# Patient Record
Sex: Female | Born: 1950 | Race: Black or African American | Hispanic: No | Smoking: Never smoker
Health system: Southern US, Community
[De-identification: ages and names within clinical notes are randomized; demographics above are authoritative.]

## PROBLEM LIST (undated history)

## (undated) DIAGNOSIS — E785 Hyperlipidemia, unspecified: Secondary | ICD-10-CM

## (undated) DIAGNOSIS — I219 Acute myocardial infarction, unspecified: Secondary | ICD-10-CM

## (undated) DIAGNOSIS — I10 Essential (primary) hypertension: Secondary | ICD-10-CM

## (undated) DIAGNOSIS — E119 Type 2 diabetes mellitus without complications: Secondary | ICD-10-CM

## (undated) HISTORY — DX: Type 2 diabetes mellitus without complications: E11.9

## (undated) HISTORY — DX: Acute myocardial infarction, unspecified: I21.9

## (undated) HISTORY — DX: Hyperlipidemia, unspecified: E78.5

## (undated) HISTORY — DX: Essential (primary) hypertension: I10

---

## 2015-03-23 ENCOUNTER — Inpatient Hospital Stay
Admission: EM | Admit: 2015-03-23 | Discharge: 2015-03-26 | DRG: 282 | Disposition: A | Discharge status: Home / Self Care | Payer: PRIVATE HEALTH INSURANCE | Attending: Internal Medicine | Admitting: Internal Medicine

## 2015-03-23 ENCOUNTER — Emergency Department: Payer: PRIVATE HEALTH INSURANCE

## 2015-03-23 ENCOUNTER — Encounter: Payer: Self-pay | Admitting: Emergency Medicine

## 2015-03-23 DIAGNOSIS — Z9119 Patient's noncompliance with other medical treatment and regimen: Secondary | ICD-10-CM | POA: Diagnosis not present

## 2015-03-23 DIAGNOSIS — F17210 Nicotine dependence, cigarettes, uncomplicated: Secondary | ICD-10-CM | POA: Diagnosis present

## 2015-03-23 DIAGNOSIS — I160 Hypertensive urgency: Secondary | ICD-10-CM | POA: Diagnosis present

## 2015-03-23 DIAGNOSIS — R0600 Dyspnea, unspecified: Secondary | ICD-10-CM | POA: Diagnosis present

## 2015-03-23 DIAGNOSIS — E785 Hyperlipidemia, unspecified: Secondary | ICD-10-CM | POA: Diagnosis present

## 2015-03-23 DIAGNOSIS — I214 Non-ST elevation (NSTEMI) myocardial infarction: Principal | ICD-10-CM | POA: Diagnosis present

## 2015-03-23 DIAGNOSIS — E1165 Type 2 diabetes mellitus with hyperglycemia: Secondary | ICD-10-CM | POA: Diagnosis present

## 2015-03-23 DIAGNOSIS — E860 Dehydration: Secondary | ICD-10-CM | POA: Diagnosis present

## 2015-03-23 DIAGNOSIS — R739 Hyperglycemia, unspecified: Secondary | ICD-10-CM

## 2015-03-23 DIAGNOSIS — Z794 Long term (current) use of insulin: Secondary | ICD-10-CM

## 2015-03-23 DIAGNOSIS — Z9114 Patient's other noncompliance with medication regimen: Secondary | ICD-10-CM | POA: Diagnosis not present

## 2015-03-23 DIAGNOSIS — E119 Type 2 diabetes mellitus without complications: Secondary | ICD-10-CM

## 2015-03-23 DIAGNOSIS — I10 Essential (primary) hypertension: Secondary | ICD-10-CM | POA: Diagnosis present

## 2015-03-23 LAB — APTT: aPTT: 30 seconds (ref 24–36)

## 2015-03-23 LAB — URINALYSIS COMPLETE WITH MICROSCOPIC (ARMC ONLY)
BILIRUBIN URINE: NEGATIVE
Bacteria, UA: NONE SEEN
Glucose, UA: >500 mg/dL — AB
Hgb urine dipstick: NEGATIVE
LEUKOCYTES UA: NEGATIVE
Nitrite: NEGATIVE
PH: 6 (ref 5.0–8.0)
PROTEIN: 30 mg/dL — AB
SPECIFIC GRAVITY, URINE: 1.038 — AB (ref 1.005–1.030)

## 2015-03-23 LAB — BASIC METABOLIC PANEL
ANION GAP: 12 (ref 5–15)
BUN: 10 mg/dL (ref 6–20)
CHLORIDE: 96 mmol/L — AB (ref 101–111)
CO2: 27 mmol/L (ref 22–32)
Calcium: 9.6 mg/dL (ref 8.9–10.3)
Creatinine, Ser: 0.81 mg/dL (ref 0.44–1.00)
Glucose, Bld: 520 mg/dL (ref 65–99)
POTASSIUM: 4.3 mmol/L (ref 3.5–5.1)
SODIUM: 135 mmol/L (ref 135–145)

## 2015-03-23 LAB — CBC WITH DIFFERENTIAL/PLATELET
BASOS ABS: 0.1 10*3/uL (ref 0–0.1)
BASOS PCT: 1 %
EOS ABS: 0.1 10*3/uL (ref 0–0.7)
Eosinophils Relative: 1 %
HCT: 49.1 % — ABNORMAL HIGH (ref 35.0–47.0)
HEMOGLOBIN: 16.3 g/dL — AB (ref 12.0–16.0)
LYMPHS ABS: 3 10*3/uL (ref 1.0–3.6)
Lymphocytes Relative: 36 %
MCH: 30.5 pg (ref 26.0–34.0)
MCHC: 33.1 g/dL (ref 32.0–36.0)
MCV: 92.3 fL (ref 80.0–100.0)
Monocytes Absolute: 0.6 10*3/uL (ref 0.2–0.9)
Monocytes Relative: 7 %
NEUTROS PCT: 55 %
Neutro Abs: 4.5 10*3/uL (ref 1.4–6.5)
PLATELETS: 119 10*3/uL — AB (ref 150–440)
RBC: 5.33 MIL/uL — AB (ref 3.80–5.20)
RDW: 13 % (ref 11.5–14.5)
WBC: 8.2 10*3/uL (ref 3.6–11.0)

## 2015-03-23 LAB — GLUCOSE, CAPILLARY
GLUCOSE-CAPILLARY: 291 mg/dL — AB (ref 65–99)
Glucose-Capillary: 506 mg/dL — ABNORMAL HIGH (ref 65–99)
Glucose-Capillary: 359 mg/dL — ABNORMAL HIGH (ref 65–99)

## 2015-03-23 LAB — PROTIME-INR
INR: 1.05
PROTHROMBIN TIME: 13.9 s (ref 11.4–15.0)

## 2015-03-23 LAB — TROPONIN I: TROPONIN I: 0.66 ng/mL — AB (ref <0.031)

## 2015-03-23 MED ORDER — SODIUM CHLORIDE 0.9 % IV BOLUS (SEPSIS)
1000.0000 mL | Freq: Once | INTRAVENOUS | Status: AC
  Administered 2015-03-23: 1000 mL via INTRAVENOUS

## 2015-03-23 MED ORDER — ONDANSETRON HCL 4 MG/2ML IJ SOLN: 4.0000 mg | Freq: Four times a day (QID) | INTRAMUSCULAR | Status: DC | PRN

## 2015-03-23 MED ORDER — LISINOPRIL 5 MG PO TABS
5.0000 mg | ORAL_TABLET | Freq: Every day | ORAL | Status: DC
  Administered 2015-03-23 – 2015-03-25 (×3): 5 mg via ORAL
  Filled 2015-03-23 (×3): qty 1

## 2015-03-23 MED ORDER — ACETAMINOPHEN 650 MG RE SUPP: 650.0000 mg | Freq: Four times a day (QID) | RECTAL | Status: DC | PRN

## 2015-03-23 MED ORDER — LABETALOL HCL 5 MG/ML IV SOLN: 20.0000 mg | Freq: Once | INTRAVENOUS | Status: DC

## 2015-03-23 MED ORDER — SODIUM CHLORIDE 0.9 % IJ SOLN
3.0000 mL | Freq: Two times a day (BID) | INTRAMUSCULAR | Status: DC
  Administered 2015-03-25 – 2015-03-26 (×3): 3 mL via INTRAVENOUS

## 2015-03-23 MED ORDER — ACETAMINOPHEN 325 MG PO TABS: 650.0000 mg | ORAL_TABLET | Freq: Four times a day (QID) | ORAL | Status: DC | PRN

## 2015-03-23 MED ORDER — INSULIN ASPART 100 UNIT/ML ~~LOC~~ SOLN
0.0000 [IU] | Freq: Every day | SUBCUTANEOUS | Status: DC
Start: 2015-03-23 — End: 2015-03-26
  Administered 2015-03-23: 3 [IU] via SUBCUTANEOUS
  Administered 2015-03-24: 2 [IU] via SUBCUTANEOUS
  Filled 2015-03-23: qty 2
  Filled 2015-03-23: qty 3

## 2015-03-23 MED ORDER — PRAVASTATIN SODIUM 10 MG PO TABS
10.0000 mg | ORAL_TABLET | Freq: Every day | ORAL | Status: DC
  Administered 2015-03-24: 10 mg via ORAL
  Filled 2015-03-23: qty 1

## 2015-03-23 MED ORDER — HEPARIN BOLUS VIA INFUSION
2000.0000 [IU] | Freq: Once | INTRAVENOUS | Status: AC
  Administered 2015-03-23: 2000 [IU] via INTRAVENOUS
  Filled 2015-03-23: qty 2000

## 2015-03-23 MED ORDER — LABETALOL HCL 5 MG/ML IV SOLN
20.0000 mg | Freq: Once | INTRAVENOUS | Status: AC
  Administered 2015-03-23: 20 mg via INTRAVENOUS
  Filled 2015-03-23: qty 4

## 2015-03-23 MED ORDER — SODIUM CHLORIDE 0.9 % IV SOLN
INTRAVENOUS | Status: AC
  Administered 2015-03-23: 23:00:00 via INTRAVENOUS

## 2015-03-23 MED ORDER — PANTOPRAZOLE SODIUM 40 MG PO TBEC
40.0000 mg | DELAYED_RELEASE_TABLET | Freq: Every day | ORAL | Status: DC
  Administered 2015-03-23 – 2015-03-26 (×4): 40 mg via ORAL
  Filled 2015-03-23 (×4): qty 1

## 2015-03-23 MED ORDER — ASPIRIN EC 81 MG PO TBEC
81.0000 mg | DELAYED_RELEASE_TABLET | Freq: Every day | ORAL | Status: DC
  Administered 2015-03-24 – 2015-03-26 (×3): 81 mg via ORAL
  Filled 2015-03-23 (×3): qty 1

## 2015-03-23 MED ORDER — ONDANSETRON HCL 4 MG PO TABS: 4.0000 mg | ORAL_TABLET | Freq: Four times a day (QID) | ORAL | Status: DC | PRN

## 2015-03-23 MED ORDER — ASPIRIN 81 MG PO CHEW
324.0000 mg | CHEWABLE_TABLET | Freq: Once | ORAL | Status: AC
  Administered 2015-03-23: 324 mg via ORAL
  Filled 2015-03-23: qty 4

## 2015-03-23 MED ORDER — METOPROLOL TARTRATE 25 MG PO TABS
25.0000 mg | ORAL_TABLET | Freq: Two times a day (BID) | ORAL | Status: DC
  Administered 2015-03-23 – 2015-03-26 (×6): 25 mg via ORAL
  Filled 2015-03-23 (×6): qty 1

## 2015-03-23 MED ORDER — DOCUSATE SODIUM 100 MG PO CAPS
100.0000 mg | ORAL_CAPSULE | Freq: Two times a day (BID) | ORAL | Status: DC
  Administered 2015-03-24 – 2015-03-26 (×5): 100 mg via ORAL
  Filled 2015-03-23 (×5): qty 1

## 2015-03-23 MED ORDER — INSULIN ASPART 100 UNIT/ML ~~LOC~~ SOLN
5.0000 [IU] | Freq: Once | SUBCUTANEOUS | Status: AC
  Administered 2015-03-23: 5 [IU] via INTRAVENOUS
  Filled 2015-03-23: qty 5

## 2015-03-23 MED ORDER — HEPARIN (PORCINE) IN NACL 100-0.45 UNIT/ML-% IJ SOLN
12.0000 [IU]/kg/h | INTRAMUSCULAR | Status: DC
  Administered 2015-03-23: 12 [IU]/kg/h via INTRAVENOUS
  Filled 2015-03-23: qty 250

## 2015-03-23 MED ORDER — NITROGLYCERIN 0.4 MG SL SUBL: 0.4000 mg | SUBLINGUAL_TABLET | SUBLINGUAL | Status: DC | PRN

## 2015-03-23 MED ORDER — HEPARIN SODIUM (PORCINE) 5000 UNIT/ML IJ SOLN
INTRAMUSCULAR | Status: AC
  Administered 2015-03-23: 2000 [IU]
  Filled 2015-03-23: qty 1

## 2015-03-23 MED ORDER — INSULIN ASPART 100 UNIT/ML ~~LOC~~ SOLN
0.0000 [IU] | Freq: Three times a day (TID) | SUBCUTANEOUS | Status: DC
Start: 2015-03-24 — End: 2015-03-26
  Administered 2015-03-24: 2 [IU] via SUBCUTANEOUS
  Administered 2015-03-24 (×2): 7 [IU] via SUBCUTANEOUS
  Administered 2015-03-25: 3 [IU] via SUBCUTANEOUS
  Administered 2015-03-25: 7 [IU] via SUBCUTANEOUS
  Administered 2015-03-26 (×2): 3 [IU] via SUBCUTANEOUS
  Filled 2015-03-23: qty 7
  Filled 2015-03-23: qty 3
  Filled 2015-03-23: qty 2
  Filled 2015-03-23: qty 7
  Filled 2015-03-23: qty 3
  Filled 2015-03-23: qty 7
  Filled 2015-03-23: qty 3

## 2015-03-23 NOTE — ED Notes (Signed)
Pt reports shortness of breath since yesterday morning after exercising. Pt reports increased shortness of breath with exertion. Pt denies cough, chest pain.

## 2015-03-23 NOTE — ED Notes (Signed)
MD at bedside. 

## 2015-03-23 NOTE — ED Notes (Signed)
Lab called for critical value of troponin 0.66 and glucose 501. MD notified

## 2015-03-23 NOTE — ED Notes (Signed)
CBG 506 

## 2015-03-23 NOTE — ED Provider Notes (Addendum)
[facility] Emergency Department Provider Note  ____________________________________________  Time seen: Approximately 445 PM  I have reviewed the triage vital signs and the nursing notes.   HISTORY  Chief Complaint Shortness of Breath    HPI Audrey Murray is a 64 y.o. female  without any chronic medical history who is presenting today with shortness of breath after exercising yesterday. She denies any chest pain, nausea or vomiting. She says that she was using a stair exercise machine was on for about 10-20 minutes and afterward became acutely short of breath. She says that it is worse when she exerts herself. Says that she is also urinating about once every 2 hours. She says she has not been to a doctor in about 15 years. Says that her mother and father both had histories of strokes.Denies any cough or fever.   History reviewed. No pertinent past medical history.  There are no active problems to display for this patient.   History reviewed. No pertinent past surgical history.  No current outpatient prescriptions on file.  Allergies Review of patient's allergies indicates no known allergies.  No family history on file.  Social History Social History  Substance Use Topics  . Smoking status: Never Smoker   . Smokeless tobacco: None  . Alcohol Use: No    Review of Systems Constitutional: No fever/chills Eyes: No visual changes. ENT: No sore throat. Cardiovascular: Denies chest pain. Respiratory:  As above Gastrointestinal: No abdominal pain.  No nausea, no vomiting.  No diarrhea.  No constipation. Genitourinary: Negative for dysuria. Musculoskeletal: Negative for back pain. Skin: Negative for rash. Neurological: Negative for headaches, focal weakness or numbness.  10-point ROS otherwise negative.  ____________________________________________   PHYSICAL EXAM:  VITAL SIGNS: ED Triage Vitals  Enc Vitals Group     BP 03/23/15 1419  196/150 mmHg     Pulse Rate 03/23/15 1419 117     Resp 03/23/15 1419 22     Temp 03/23/15 1419 97.6 F (36.4 C)     Temp Source 03/23/15 1419 Oral     SpO2 03/23/15 1419 95 %     Weight 03/23/15 1419 212 lb (96.163 kg)     Height 03/23/15 1419  (1.626 m)     Head Cir --      Peak Flow --      Pain Score 03/23/15 1420 0     Pain Loc --      Pain Edu? --      Excl. in GC? --     Constitutional: Alert and oriented. Well appearing and in no acute distress. Eyes: Conjunctivae are normal. PERRL. EOMI. Head: Atraumatic. Nose: No congestion/rhinnorhea. Mouth/Throat: Mucous membranes are moist.   Neck: No stridor.   Cardiovascular:  Tachycardic, regular rhythm. Grossly normal heart sounds.  Good peripheral circulation. Respiratory: Normal respiratory effort.  No retractions. Lungs CTAB. tachypneic. Gastrointestinal: Soft and nontender. No distention. No abdominal bruits. No CVA tenderness. Musculoskeletal: No lower extremity tenderness nor edema.  No joint effusions. Neurologic:  Normal speech and language. No gross focal neurologic deficits are appreciated. No gait instability. Skin:  Skin is warm, dry and intact. No rash noted. Psychiatric: Mood and affect are normal. Speech and behavior are normal.  ____________________________________________   LABS (all labs ordered are listed, but only abnormal results are displayed)  Labs Reviewed  CBC WITH DIFFERENTIAL/PLATELET - Abnormal; Notable for the following:    RBC 5.33 (*)    Hemoglobin 16.3 (*)    HCT  49.1 (*)    Platelets 119 (*)    All other components within normal limits  BASIC METABOLIC PANEL - Abnormal; Notable for the following:    Chloride 96 (*)    Glucose, Bld 520 (*)    All other components within normal limits  TROPONIN I - Abnormal; Notable for the following:    Troponin I 0.66 (*)    All other components within normal limits  URINALYSIS COMPLETEWITH MICROSCOPIC ([facility] ONLY) - Abnormal; Notable for the  following:    Color, Urine STRAW (*)    APPearance CLEAR (*)    Glucose, UA >500 (*)    Ketones, ur 2+ (*)    Specific Gravity, Urine 1.038 (*)    Protein, ur 30 (*)    Squamous Epithelial / LPF 0-5 (*)    All other components within normal limits  GLUCOSE, CAPILLARY - Abnormal; Notable for the following:    Glucose-Capillary 506 (*)    All other components within normal limits  GLUCOSE, CAPILLARY - Abnormal; Notable for the following:    Glucose-Capillary 359 (*)    All other components within normal limits  APTT  PROTIME-INR   ____________________________________________  EKG  ED ECG REPORT I, Arelia LongestSchaevitz,  David M, the attending physician, personally viewed and interpreted this ECG.   Date: 03/23/2015  EKG Time: 1422  Rate: 111  Rhythm: sinus tachycardia  Axis: Normal axis  Intervals:none  ST&T Change: 1 mm ST depression in 1 and 2. There is no ST elevation. No abnormal T-wave inversions.  ____________________________________________  RADIOLOGY  no acute cardiopulmonary disease on chest x-ray. ____________________________________________   PROCEDURES  CRITICAL CARE Performed by: Arelia LongestSchaevitz,  David M   Total critical care time: 35 minutes  Critical care time was exclusive of separately billable procedures and treating other patients.  Critical care was necessary to treat or prevent imminent or life-threatening deterioration.  Critical care was time spent personally by me on the following activities: development of treatment plan with patient and/or surrogate as well as nursing, discussions with consultants, evaluation of patient's response to treatment, examination of patient, obtaining history from patient or surrogate, ordering and performing treatments and interventions, ordering and review of laboratory studies, ordering and review of radiographic studies, pulse oximetry and re-evaluation of patient's  condition.   ____________________________________________   INITIAL IMPRESSION / ASSESSMENT AND PLAN / ED COURSE  Pertinent labs & imaging results that were available during my care of the patient were reviewed by me and considered in my medical decision making (see chart for details).  Patient found to have glucose above 500 on her bedside Accu-Chek. We will start IV fluids. Suspect DKA with new-onset diabetes.  ----------------------------------------- 7:20 PM on 03/23/2015 -----------------------------------------  Patient resting, but this time. After 1 L of fluid the heart rate is in the 90s. She is no longer shortness having shortness of breath. She is persistently in no pain. I suspect what happened is that the patient has been having worsening of her chronic conditions for some time now. Since she has not seen a doctor in 15 years she likely has uncontrolled high blood pressure as well as diabetes. I suspect her elevated troponin is secondary to demand from her tachycardia. I explained the results as well as the need for admission to the patient as well as her daughter. The patient was signed out to Dr.Gouru.   patient does not appear DKA. Likely shortness of breath from dehydration from diabetes as well as her tachycardia causing demand.  ____________________________________________   FINAL CLINICAL IMPRESSION(S) / ED DIAGNOSES  Uncontrolled hypertension. New-onset diabetes. Shortness of breath. NSTEMI.     Myrna Blazer, MD 03/23/15 1922  Heparin drip started. Patient denies any bleeding per rectum.  Myrna Blazer, MD 03/23/15 Ernestina Columbia  Myrna Blazer, MD 03/23/15 [phone]

## 2015-03-23 NOTE — Progress Notes (Signed)
ANTICOAGULATION CONSULT NOTE - Initial Consult  Pharmacy Consult for heparin gtt  Indication: chest pain/ACS  No Known Allergies  Patient Measurements: Height: 5\' 4"  (162.6 cm) Weight: 212 lb (96.163 kg) IBW/kg (Calculated) : 54.7 Heparin Dosing Weight: 76.7 kg  Vital Signs: Temp: 97.6 F (36.4 C) (11/16 1419) Temp Source: Oral (11/16 1419) BP: 222/124 mmHg (11/16 1800) Pulse Rate: 94 (11/16 1915)  Labs:  Recent Labs  03/23/15 1641  HGB 16.3*  HCT 49.1*  PLT 119*  APTT 30  LABPROT 13.9  INR 1.05  CREATININE 0.81  TROPONINI 0.66*    Estimated Creatinine Clearance: 81.1 mL/min (by C-G formula based on Cr of 0.81).   Medical History: History reviewed. No pertinent past medical history.  Assessment: Pharmacy consulted to dose heparin gtt in this 64 year old female presenting with shortness of breath and a troponin 0.66. Patient was not taking any anticoagulants prior to admission. Baseline labs have been obtained.   Goal of Therapy:  Heparin level 0.3-0.7 units/ml Monitor platelets by anticoagulation protocol: Yes   Plan:  Give 2000 units bolus x 1 Start heparin infusion at 900 units/hr Check anti-Xa level in 6 hours and daily while on heparin Continue to monitor H&H and platelets  Jodelle RedMary M Vian Fluegel 03/23/2015,7:54 PM

## 2015-03-23 NOTE — H&P (Signed)
[facility] [place] [facility]   PATIENT NAME: Audrey DeedsWardelia Ackley    MR#:  [id number]  DATE OF BIRTH:  1951/01/22  DATE OF ADMISSION:  03/23/2015  PRIMARY CARE PHYSICIAN: No PCP Per Patient   REQUESTING/REFERRING PHYSICIAN: Pershing ProudSchaevitz, MD  CHIEF COMPLAINT:   Shortness of breath HISTORY OF PRESENT ILLNESS:  Audrey Murray  is a 64 y.o. female with no medical problems is new she was not seen by any doctors in the past 15 years is presenting to the ED with a chief complaint of shortness of breath after exercising yesterday. Patient is reporting frequent urination and being thirsty. Denies any chest pain or nausea or vomiting. Patient's blood sugar was at 520 and troponin is elevated 0.66 with no acute ST-T wave changes on EKG. Patient denies any dizziness or loss of consciousness  PAST MEDICAL HISTORY:  History reviewed. No pertinent past medical history.  PAST SURGICAL HISTOIRY:  History reviewed. No pertinent past surgical history.  SOCIAL HISTORY:   Social History  Substance Use Topics  . Smoking status: Never Smoker   . Smokeless tobacco: Not on file  . Alcohol Use: No    FAMILY HISTORY:  Both parents have history of strokes and hypertension  DRUG ALLERGIES:  No Known Allergies  REVIEW OF SYSTEMS:  CONSTITUTIONAL: No fever, reporting fatigue and weakness.  EYES: No blurred or double vision.  EARS, NOSE, AND THROAT: No tinnitus or ear pain.  RESPIRATORY: No cough, reporting shortness of breath from yesterday after exercising, denies wheezing or hemoptysis.  CARDIOVASCULAR: No chest pain, orthopnea, edema.  GASTROINTESTINAL: No nausea, vomiting, diarrhea or abdominal pain.  GENITOURINARY: No dysuria, hematuria.  ENDOCRINE: Reports polyuria and polydipsia HEMATOLOGY: No anemia, easy bruising or bleeding SKIN: No rash or lesion. MUSCULOSKELETAL: No joint pain or arthritis.   NEUROLOGIC: No tingling, numbness, weakness.  PSYCHIATRY: No  anxiety or depression.   MEDICATIONS AT HOME:   Prior to Admission medications   Not on File      VITAL SIGNS:  Blood pressure 222/124, pulse 100, temperature 97.6 F (36.4 C), temperature source Oral, resp. rate 20, height 5\' 4"  (1.626 m), weight 96.163 kg (212 lb), SpO2 95 %.  PHYSICAL EXAMINATION:  GENERAL:  64 y.o.-year-old patient lying in the bed with no acute distress.  EYES: Pupils equal, round, reactive to light and accommodation. No scleral icterus. Extraocular muscles intact.  HEENT: Head atraumatic, normocephalic. Oropharynx and nasopharynx clear. Dry mucous membranes NECK:  Supple, no jugular venous distention. No thyroid enlargement, no tenderness.  LUNGS: Normal breath sounds bilaterally, no wheezing, rales,rhonchi or crepitation. No use of accessory muscles of respiration.  CARDIOVASCULAR: S1, S2 normal. No murmurs, rubs, or gallops.  ABDOMEN: Soft, nontender, nondistended. Bowel sounds present. No organomegaly or mass.  EXTREMITIES: No pedal edema, cyanosis, or clubbing.  NEUROLOGIC: Cranial nerves II through XII are intact. Muscle strength 5/5 in all extremities. Sensation intact. Gait not checked.  PSYCHIATRIC: The patient is alert and oriented x 3.  SKIN: No obvious rash, lesion, or ulcer.   LABORATORY PANEL:   CBC  Recent Labs Lab 03/23/15 1641  WBC 8.2  HGB 16.3*  HCT 49.1*  PLT 119*   ------------------------------------------------------------------------------------------------------------------  Chemistries   Recent Labs Lab 03/23/15 1641  NA 135  K 4.3  CL 96*  CO2 27  GLUCOSE 520*  BUN 10  CREATININE 0.81  CALCIUM 9.6   ------------------------------------------------------------------------------------------------------------------  Cardiac Enzymes  Recent Labs Lab 03/23/15 1641  TROPONINI 0.66*   ------------------------------------------------------------------------------------------------------------------  RADIOLOGY:   Dg Chest 2 View  03/23/2015  CLINICAL DATA:  Patient has increasing SOB since yesterday morning. Patient states that she was exercising for the first time in a long while and shortly afterwards began having SOB and some difficulty breathing. Patient is a nonsmoker and has n.*comment was truncated* EXAM: CHEST  2 VIEW COMPARISON:  None. FINDINGS: Moderate mid thoracic spondylosis. Midline trachea. Normal heart size. Moderately tortuous thoracic aorta. No pleural effusion or pneumothorax. Clear lungs. IMPRESSION: No acute cardiopulmonary disease. Tortuous thoracic aorta. Electronically Signed   By: Jeronimo Greaves M.D.   On: 03/23/2015 14:55    EKG:   Orders placed or performed during the hospital encounter of 03/23/15  . ED EKG  . ED EKG  . EKG 12-Lead  . EKG 12-Lead    IMPRESSION AND PLAN:   Korbyn Murray  is a 64 y.o. female with no medical problems is new she was not seen by any doctors in the past 15 years is presenting to the ED with a chief complaint of shortness of breath after exercising yesterday. Patient is reporting frequent urination and being thirsty. Denies any chest pain or nausea or vomiting. Patient's blood sugar was at 520 and troponin is elevated 0.66 with no acute ST-T wave changes on EKG.  Assessment and plan  #1. Non-STEMI Initial troponin is at 0. 66 with nonspecific EKG changes Patient is started on heparin drip per protocol Will provide her aspirin 81 mg, beta blocker and statin We will give her nitroglycerin sublingually as needed for chest pain Monitor patient on telemetry Cycle cardiac biomarkers to monitor troponin trend Cardiology consult is placed and discussed with Dr. Lady Gary Check fasting lipid panel in a.m. Will get an echocardiogram  #2 new-onset diabetes mellitus with hyperglycemia Initial blood sugar is at 520s 5 units of IV insulin was given in the ED and fluid boluses were also given Will check hemoglobin A1c Continue hydration with IV  fluids Patient is not in DKA, her anion gap and bicarbonate are normal Will provide her sliding scale insulin  #3 malignant hypertension Patient is not on any home medications as she is noncompliant We'll start her on small dose beta blocker as well as lisinopril and titrate as needed basis  #4. Noncompliance Patient not seen by any doctors for more than 15 years Reinforced the importance of being compliant with her medications and follow-up visits with PCP    All the records are reviewed and case discussed with ED provider. Management plans discussed with the patient, family and they are in agreement.  CODE STATUS: Full code, daughter is the healthcare power of attorney  TOTAL critical care TIME TAKING CARE OF THIS PATIENT: 50 minutes.    Ramonita Lab M.D on 03/23/2015 at 7:31 PM  Between 7am to 6pm - Pager - [phone]  After 6pm go to [email or web address] - password [email or web address] [facility]  [facility]  Office  [phone]  CC: Primary care physician; No PCP Per Patient

## 2015-03-24 ENCOUNTER — Inpatient Hospital Stay
Admit: 2015-03-24 | Discharge: 2015-03-24 | Disposition: A | Discharge status: Home / Self Care | Payer: PRIVATE HEALTH INSURANCE | Attending: Internal Medicine | Admitting: Internal Medicine

## 2015-03-24 LAB — PROTIME-INR
INR: 1.1
Prothrombin Time: 14.4 seconds (ref 11.4–15.0)

## 2015-03-24 LAB — TSH: TSH: 1.678 u[IU]/mL (ref 0.350–4.500)

## 2015-03-24 LAB — HEPARIN LEVEL (UNFRACTIONATED)
HEPARIN UNFRACTIONATED: 0.25 [IU]/mL — AB (ref 0.30–0.70)
HEPARIN UNFRACTIONATED: 0.4 [IU]/mL (ref 0.30–0.70)
Heparin Unfractionated: 0.5 IU/mL (ref 0.30–0.70)

## 2015-03-24 LAB — COMPREHENSIVE METABOLIC PANEL
ALBUMIN: 3.1 g/dL — AB (ref 3.5–5.0)
ALT: 25 U/L (ref 14–54)
AST: 22 U/L (ref 15–41)
Alkaline Phosphatase: 69 U/L (ref 38–126)
Anion gap: 8 (ref 5–15)
BUN: 12 mg/dL (ref 6–20)
CHLORIDE: 105 mmol/L (ref 101–111)
CO2: 23 mmol/L (ref 22–32)
CREATININE: 0.74 mg/dL (ref 0.44–1.00)
Calcium: 8.5 mg/dL — ABNORMAL LOW (ref 8.9–10.3)
GFR calc Af Amer: >60 mL/min (ref >60)
GFR calc non Af Amer: >60 mL/min (ref >60)
GLUCOSE: 384 mg/dL — AB (ref 65–99)
POTASSIUM: 3.8 mmol/L (ref 3.5–5.1)
Sodium: 136 mmol/L (ref 135–145)
Total Bilirubin: 0.6 mg/dL (ref 0.3–1.2)
Total Protein: 5.7 g/dL — ABNORMAL LOW (ref 6.5–8.1)

## 2015-03-24 LAB — LIPID PANEL
CHOL/HDL RATIO: 5.6 ratio
Cholesterol: 186 mg/dL (ref 0–200)
HDL: 33 mg/dL — AB (ref >40)
LDL CALC: 113 mg/dL — AB (ref 0–99)
TRIGLYCERIDES: 199 mg/dL — AB (ref <150)
VLDL: 40 mg/dL (ref 0–40)

## 2015-03-24 LAB — CBC WITH DIFFERENTIAL/PLATELET
BASOS ABS: 0.1 10*3/uL (ref 0–0.1)
Eosinophils Absolute: 0.1 10*3/uL (ref 0–0.7)
HCT: 41.7 % (ref 35.0–47.0)
Hemoglobin: 13.9 g/dL (ref 12.0–16.0)
Lymphs Abs: 3 10*3/uL (ref 1.0–3.6)
MCH: 30.7 pg (ref 26.0–34.0)
MCHC: 33.3 g/dL (ref 32.0–36.0)
MCV: 92.1 fL (ref 80.0–100.0)
MONO ABS: 0.5 10*3/uL (ref 0.2–0.9)
Neutro Abs: 4.4 10*3/uL (ref 1.4–6.5)
Neutrophils Relative %: 55 %
Platelets: 98 10*3/uL — ABNORMAL LOW (ref 150–440)
RBC: 4.53 MIL/uL (ref 3.80–5.20)
RDW: 12.8 % (ref 11.5–14.5)
WBC: 8 10*3/uL (ref 3.6–11.0)

## 2015-03-24 LAB — TROPONIN I
TROPONIN I: 0.39 ng/mL — AB (ref <0.031)
TROPONIN I: 0.36 ng/mL — AB (ref <0.031)

## 2015-03-24 LAB — GLUCOSE, CAPILLARY
GLUCOSE-CAPILLARY: 310 mg/dL — AB (ref 65–99)
GLUCOSE-CAPILLARY: 152 mg/dL — AB (ref 65–99)
Glucose-Capillary: 323 mg/dL — ABNORMAL HIGH (ref 65–99)
Glucose-Capillary: 234 mg/dL — ABNORMAL HIGH (ref 65–99)

## 2015-03-24 LAB — HEMOGLOBIN A1C: HEMOGLOBIN A1C: 15.7 % — AB (ref 4.0–6.0)

## 2015-03-24 MED ORDER — LIVING WELL WITH DIABETES BOOK
Freq: Once | Status: AC
  Administered 2015-03-24: 11:00:00
  Filled 2015-03-24: qty 1

## 2015-03-24 MED ORDER — SODIUM CHLORIDE 0.9 % IJ SOLN: 3.0000 mL | INTRAMUSCULAR | Status: DC | PRN

## 2015-03-24 MED ORDER — SODIUM CHLORIDE 0.9 % IJ SOLN
3.0000 mL | Freq: Two times a day (BID) | INTRAMUSCULAR | Status: DC
  Administered 2015-03-24 (×2): 3 mL via INTRAVENOUS

## 2015-03-24 MED ORDER — SODIUM CHLORIDE 0.9 % IV SOLN: 250.0000 mL | INTRAVENOUS | Status: DC | PRN

## 2015-03-24 MED ORDER — HEPARIN BOLUS VIA INFUSION
1200.0000 [IU] | Freq: Once | INTRAVENOUS | Status: AC
Start: 2015-03-24 — End: 2015-03-24
  Administered 2015-03-24: 1200 [IU] via INTRAVENOUS
  Filled 2015-03-24: qty 1200

## 2015-03-24 MED ORDER — HEPARIN (PORCINE) IN NACL 100-0.45 UNIT/ML-% IJ SOLN
1050.0000 [IU]/h | INTRAMUSCULAR | Status: DC
  Administered 2015-03-24 (×2): 1050 [IU]/h via INTRAVENOUS
  Filled 2015-03-24: qty 250

## 2015-03-24 MED ORDER — SODIUM CHLORIDE 0.9 % WEIGHT BASED INFUSION
3.0000 mL/kg/h | INTRAVENOUS | Status: AC
  Administered 2015-03-25: 3 mL/kg/h via INTRAVENOUS

## 2015-03-24 MED ORDER — SODIUM CHLORIDE 0.9 % WEIGHT BASED INFUSION: 1.0000 mL/kg/h | INTRAVENOUS | Status: DC

## 2015-03-24 MED ORDER — ASPIRIN 81 MG PO CHEW
81.0000 mg | CHEWABLE_TABLET | ORAL | Status: AC
  Administered 2015-03-25: 81 mg via ORAL
  Filled 2015-03-24: qty 1

## 2015-03-24 MED ORDER — INSULIN GLARGINE 100 UNIT/ML ~~LOC~~ SOLN
10.0000 [IU] | Freq: Every day | SUBCUTANEOUS | Status: DC
  Administered 2015-03-24: 10 [IU] via SUBCUTANEOUS
  Filled 2015-03-24 (×2): qty 0.1

## 2015-03-24 NOTE — Progress Notes (Addendum)
ANTICOAGULATION CONSULT NOTE - Initial Consult  Pharmacy Consult for heparin gtt  Indication: chest pain/ACS  No Known Allergies  Patient Measurements: Height: 5\' 4"  (162.6 cm) Weight: 212 lb (96.163 kg) IBW/kg (Calculated) : 54.7 Heparin Dosing Weight: 76.7 kg  Vital Signs: Temp: 97.5 F (36.4 C) (11/16 2128) Temp Source: Oral (11/16 2128) BP: 140/97 mmHg (11/16 2128) Pulse Rate: 92 (11/16 2128)  Labs:  Recent Labs  03/23/15 1641 03/24/15 0251  HGB 16.3*  --   HCT 49.1*  --   PLT 119*  --   APTT 30  --   LABPROT 13.9  --   INR 1.05  --   HEPARINUNFRC  --  0.25*  CREATININE 0.81  --   TROPONINI 0.66* 0.39*    Estimated Creatinine Clearance: 81.1 mL/min (by C-G formula based on Cr of 0.81).   Medical History: History reviewed. No pertinent past medical history.  Assessment: Pharmacy consulted to dose heparin gtt in this 64 year old female presenting with shortness of breath and a troponin 0.66. Patient was not taking any anticoagulants prior to admission. Baseline labs have been obtained.   Goal of Therapy:  Heparin level 0.3-0.7 units/ml Monitor platelets by anticoagulation protocol: Yes   Plan:  Give 2000 units bolus x 1 Start heparin infusion at 900 units/hr Check anti-Xa level in 6 hours and daily while on heparin Continue to monitor H&H and platelets   11/17 03:00 heparin level 0.25. 1200 unit bolus and increase rate to 1050 units/hr. Recheck in 6 hours.  Kynan Peasley S 03/24/2015,4:53 AM

## 2015-03-24 NOTE — Care Management (Signed)
Anticipating need for cardiac cath

## 2015-03-24 NOTE — Progress Notes (Signed)
Initial Nutrition Assessment     INTERVENTION:  Nutrition diet education:  RD provided "Carbohydrate Counting for People with Diabetes" handout from the Academy of Nutrition and Dietetics. Discussed different food groups and their effects on blood sugar, emphasizing carbohydrate-containing foods. Provided list of carbohydrates and recommended serving sizes of common foods.  Discussed importance of controlled and consistent carbohydrate intake throughout the day. Provided examples of ways to balance meals/snacks and encouraged intake of high-fiber, whole grain complex carbohydrates. Teach back method used.  Expect good compliance.    NUTRITION DIAGNOSIS:   Food and nutrition related knowledge deficit related to  (new diagnosis of DM) as evidenced by  (consult for diet education).    GOAL:   Patient will meet greater than or equal to 90% of their needs    MONITOR:    (Energy intake, glucose profile)  REASON FOR ASSESSMENT:   Consult Diet education  ASSESSMENT:      Pt admitted with new onset DM  History reviewed. No pertinent past medical history.    Current Nutrition: eating and tolerating meals  Food/Nutrition-Related History: normal intake prior to admission. Had started reducing portion sizes prior to admission to promote weight loss.   Scheduled Medications:  . [START ON 03/25/2015] aspirin  81 mg Oral Pre-Cath  . aspirin EC  81 mg Oral Daily  . docusate sodium  100 mg Oral BID  . insulin aspart  0-5 Units Subcutaneous QHS  . insulin aspart  0-9 Units Subcutaneous TID WC  . insulin glargine  10 Units Subcutaneous QHS  . lisinopril  5 mg Oral Daily  . metoprolol tartrate  25 mg Oral BID  . pantoprazole  40 mg Oral Daily  . pravastatin  10 mg Oral q1800  . sodium chloride  3 mL Intravenous Q12H  . sodium chloride  3 mL Intravenous Q12H    Continuous Medications:  . [START ON 03/25/2015] sodium chloride     Followed by  . [START ON 03/25/2015] sodium  chloride    . heparin 1,050 Units/hr (03/24/15 0514)     Electrolyte/Renal Profile and Glucose Profile:   Recent Labs Lab 03/23/15 1641 03/24/15 0251  NA 135 136  K 4.3 3.8  CL 96* 105  CO2 27 23  BUN 10 12  CREATININE 0.81 0.74  CALCIUM 9.6 8.5*  GLUCOSE 520* 384*   Protein Profile:  Recent Labs Lab 03/24/15 0251  ALBUMIN 3.1*    Gastrointestinal Profile: Last BM:11/16    Weight Change: pt reports slow weight loss, but intentionally trying to loose wt   Diet Order:  Diet NPO time specified Except for: Sips with Meds Diet heart healthy/carb modified Room service appropriate?: Yes; Fluid consistency:: Thin  Skin:   reviewed   Height:   Ht Readings from Last 1 Encounters:  03/23/15 5\' 4"  (1.626 m)    Weight:   Wt Readings from Last 1 Encounters:  03/23/15 212 lb (96.163 kg)    Ideal Body Weight:     BMI:  Body mass index is 36.37 kg/(m^2).  EDUCATION NEEDS:   Education needs addressed  LOW Care Level  Audrey Murray, RD, LDN [phone] (pager)

## 2015-03-24 NOTE — Progress Notes (Signed)
Inpatient Diabetes Program Recommendations  AACE/ADA: New Consensus Statement on Inpatient Glycemic Control (2015)  Target Ranges:  Prepandial:   less than 140 mg/dL      Peak postprandial:   less than 180 mg/dL (1-2 hours)      Critically ill patients:  140 - 180 mg/dL   Review of Glycemic Control  Results for Audrey Murray, Audrey Murray (MRN [id number]) as of 03/24/2015 07:25  Ref. Range 03/23/2015 16:46 03/23/2015 18:56 03/23/2015 21:48  Glucose-Capillary Latest Ref Range: 65-99 mg/dL 782506 (H) 956359 (H) 213291 (H)     Diabetes history: none Outpatient Diabetes medications: none Current orders for Inpatient glycemic control: Novolog 0-9 units tid, Novolog 0-5 units qhs  Inpatient Diabetes Program Recommendations: I have ordered the Living Well with Diabetes education book.  I will speak to the patient re: diagnosis later today.   MD- please add diabetes to the problem list  Susette RacerJulie Quron Ruddy, RN, [place], [place]HA, CDE Diabetes Coordinator Inpatient Diabetes Program  [phone] (Team Pager) [phone] [facility] Office) 03/24/2015 7:27 AM

## 2015-03-24 NOTE — Progress Notes (Signed)
[facility] - [place] at [facility]   PATIENT NAME: Audrey Murray    MR#:  [id number]  DATE OF BIRTH:  [birth year]  SUBJECTIVE: 64 year old female female patient came in because of acute onset of shortness of breath. Her blood pressure was 222/124. Patient right now has no complaints. No chest pain, no shortness of breath. BG : Up to 359. No history of diabetes, hypertension. Troponin 0.39 and cardiac catheter tomorrow; continue heparin drip...   CHIEF COMPLAINT:   Chief Complaint  Patient presents with  . Shortness of Breath    REVIEW OF SYSTEMS:    Review of Systems  Constitutional: Negative for fever and chills.  HENT: Negative for hearing loss.   Eyes: Negative for blurred vision, double vision and photophobia.  Respiratory: Negative for cough, hemoptysis and shortness of breath.   Cardiovascular: Negative for palpitations, orthopnea and leg swelling.  Gastrointestinal: Negative for vomiting, abdominal pain and diarrhea.  Genitourinary: Negative for dysuria and urgency.  Musculoskeletal: Negative for myalgias and neck pain.  Skin: Negative for rash.  Neurological: Negative for dizziness, focal weakness, seizures, weakness and headaches.  Psychiatric/Behavioral: Negative for memory loss. The patient does not have insomnia.     Nutrition:  Tolerating Diet: Tolerating PT:      DRUG ALLERGIES:  No Known Allergies  VITALS:  Blood pressure 158/94, pulse 81, temperature 98.2 F (36.8 C), temperature source Oral, resp. rate 18, height  (1.626 m), weight 96.163 kg (212 lb), SpO2 96 %.  PHYSICAL EXAMINATION:   Physical Exam  GENERAL:  64 y.o.-year-old patient lying in the bed with no acute distress.  EYES: Pupils equal, round, reactive to light and accommodation. No scleral icterus. Extraocular muscles intact.  HEENT: Head atraumatic, normocephalic. Oropharynx and nasopharynx clear.  NECK:  Supple, no jugular venous distention. No thyroid  enlargement, no tenderness.  LUNGS: Normal breath sounds bilaterally, no wheezing, rales,rhonchi or crepitation. No use of accessory muscles of respiration.  CARDIOVASCULAR: S1, S2 normal. No murmurs, rubs, or gallops.  ABDOMEN: Soft, nontender, nondistended. Bowel sounds present. No organomegaly or mass.  EXTREMITIES: No pedal edema, cyanosis, or clubbing.  NEUROLOGIC: Cranial nerves II through XII are intact. Muscle strength 5/5 in all extremities. Sensation intact. Gait not checked.  PSYCHIATRIC: The patient is alert and oriented x 3.  SKIN: No obvious rash, lesion, or ulcer.    LABORATORY PANEL:   CBC  Recent Labs Lab 03/24/15 0251  WBC 8.0  HGB 13.9  HCT 41.7  PLT 98*   ------------------------------------------------------------------------------------------------------------------  Chemistries   Recent Labs Lab 03/24/15 0251  NA 136  K 3.8  CL 105  CO2 23  GLUCOSE 384*  BUN 12  CREATININE 0.74  CALCIUM 8.5*  AST 22  ALT 25  ALKPHOS 69  BILITOT 0.6   ------------------------------------------------------------------------------------------------------------------  Cardiac Enzymes  Recent Labs Lab 03/24/15 0843  TROPONINI 0.36*   ------------------------------------------------------------------------------------------------------------------  RADIOLOGY:  Dg Chest 2 View  03/23/2015  CLINICAL DATA:  Patient has increasing SOB since yesterday morning. Patient states that she was exercising for the first time in a long while and shortly afterwards began having SOB and some difficulty breathing. Patient is a nonsmoker and has n.*comment was truncated* EXAM: CHEST  2 VIEW COMPARISON:  None. FINDINGS: Moderate mid thoracic spondylosis. Midline trachea. Normal heart size. Moderately tortuous thoracic aorta. No pleural effusion or pneumothorax. Clear lungs. IMPRESSION: No acute cardiopulmonary disease. Tortuous thoracic aorta. Electronically Signed   By: Jeronimo Greaves M.D.   On: 03/23/2015  14:55     ASSESSMENT AND PLAN:   Active Problems:   Non-STEMI (non-ST elevated myocardial infarction) (HCC)   Hypertensive urgency with acute shortness of breath, non-ST elevation MI, diabetes mellitus patient may have angina equivalence. Continue heparin drip, aspirin, high  intensity statins, beta blockers, Ace inhibitors. Patient blood pressure is better at this time.  2.NSTEMI; ; continue aspirin , beta blockers, statins, nitrates, heparin drip, cardiology followed the patient and patient is scheduled to have a cardiac catheterization tomorrow.   #3 diabetes mellitus: New diagnosis. Hemoglobin A1c: Continue Lantus, NovoLog 5 units 3 times a day and the NovoLog correction also. Needs a diabetic education.  #4 hyperlipidemia and LDL more than 113. Continue statins.  All the records are reviewed and case discussed with Care Management/Social Workerr. Management plans discussed with the patient, family and they are in agreement.  CODE STATUS:full  TOTAL TIME TAKING CARE OF THIS PATIENT: 35 minutes.   POSSIBLE D/C IN 2DAYS, DEPENDING ON CLINICAL CONDITION.   Katha HammingKONIDENA,Temple Sporer M.D on 03/24/2015 at 3:53 PM  Between 7am to 6pm - Pager - [phone]  After 6pm go to [email or web address] - password [email or web address] [email or web address]  [facility]  Office  [phone]  CC: Primary care physician; No PCP Per Patient

## 2015-03-24 NOTE — Clinical Documentation Improvement (Signed)
Internal Medicine  Current coding guidelines have changed in regards to Hypertension. Please further clarify malignant hypertension in the progress notes and discharge summary   Hypertensive emergency  Hypertensive crisis  Hypertensive urgency  Other  Clinically Undetermined  Supporting Information:  BP on admission 196/150 11/16  SBP 222-140    DBP  154-92 11/17  SBP 171-158    DBP    99-94  Has not seen a doctor in 15 years  H&P: Malignant hypertension. Patient is not on any home medications as she is noncompliant. We'll start her on small dose beta blocker as well as lisinopril and titrate as needed basis  Treatment: IV Labetalol 20mg  x 1 dose Lisinopril 5mg  po daily Lopressor 25mg  bid Monitor vital signs  Please exercise your independent, professional judgment when responding. A specific answer is not anticipated or expected.   Thank You, Harless Littenebora T Antiono Ettinger Health Information Management [place] [phone]

## 2015-03-24 NOTE — Progress Notes (Signed)
ANTICOAGULATION CONSULT NOTE - Initial Consult  Pharmacy Consult for heparin gtt  Indication: chest pain/ACS  No Known Allergies  Patient Measurements: Height: 5\' 4"  (162.6 cm) Weight: 212 lb (96.163 kg) IBW/kg (Calculated) : 54.7 Heparin Dosing Weight: 76.7 kg  Vital Signs: Temp: 98.2 F (36.8 C) (11/17 1205) Temp Source: Oral (11/17 1205) BP: 158/94 mmHg (11/17 1205) Pulse Rate: 81 (11/17 1205)  Labs:  Recent Labs  03/23/15 1641 03/24/15 0251 03/24/15 0843 03/24/15 1049 03/24/15 1502  HGB 16.3* 13.9  --   --   --   HCT 49.1* 41.7  --   --   --   PLT 119* 98*  --   --   --   APTT 30  --   --   --   --   LABPROT 13.9  --  14.4  --   --   INR 1.05  --  1.10  --   --   HEPARINUNFRC  --  0.25*  --  0.50 0.40  CREATININE 0.81 0.74  --   --   --   TROPONINI 0.66* 0.39* 0.36*  --   --     Estimated Creatinine Clearance: 82.1 mL/min (by C-G formula based on Cr of 0.74).   Medical History: History reviewed. No pertinent past medical history.  Assessment: Pharmacy consulted to dose heparin gtt in this 64 year old female presenting with shortness of breath and a troponin 0.66. Patient was not taking any anticoagulants prior to admission. Baseline labs have been obtained.   Goal of Therapy:  Heparin level 0.3-0.7 units/ml Monitor platelets by anticoagulation protocol: Yes   Plan:  Give 2000 units bolus x 1 Start heparin infusion at 900 units/hr Check anti-Xa level in 6 hours and daily while on heparin Continue to monitor H&H and platelets   11/17 11:00 heparin level 0.50. Continue rate of 1050 and get a conformation level in 6 hours.  11/17 heparin level @ 1500 resulted therapeutic at 0.4 units/mL. Will continue with current rate of 1050 units/hr and will order heparin level with AM labs tomorrow.   Jodelle RedMary M Jacquelyne Quarry 03/24/2015,4:23 PM

## 2015-03-24 NOTE — Progress Notes (Signed)
Inpatient Diabetes Program Recommendations  AACE/ADA: New Consensus Statement on Inpatient Glycemic Control (2015)  Target Ranges:  Prepandial:   less than 140 mg/dL      Peak postprandial:   less than 180 mg/dL (1-2 hours)      Critically ill patients:  140 - 180 mg/dL   Review of Glycemic Control  Results for [name], Audrey Murray (MRN [id number]) as of 03/24/2015 14:46  Ref. Range 03/23/2015 16:46 03/23/2015 18:56 03/23/2015 21:48 03/24/2015 07:50 03/24/2015 12:07  Glucose-Capillary Latest Ref Range: 65-99 mg/dL 782506 (H) 956359 (H) 213291 (H) 323 (H) 310 (H)    Diabetes history: none Outpatient Diabetes medications: none Current orders for Inpatient glycemic control: Novolog 0-9 units tid with meals, Novolog 0-5 units qhs  Inpatient Diabetes; CBG remain very elevated - Consider starting Lantus insulin 20 units qhs (0.20units/kg). Consider starting Novolog insulin 5 units tid and change Novolog correction to moderate correction scale tid and hs.   I have instructed her and her family on the basic physiology of diabetes, the need for a diet change, the long term complications of diabetes and the need for medication at discharge.  I have instructed her that she will need to be checking blood sugar when she leaves here. All questions answered.   She is agreeable to going to the [facility] for diabetes education- her daughter will go with her.

## 2015-03-24 NOTE — Progress Notes (Signed)
ANTICOAGULATION CONSULT NOTE - Initial Consult  Pharmacy Consult for heparin gtt  Indication: chest pain/ACS  No Known Allergies  Patient Measurements: Height: $R emoveBeforeDEID_iGBLUpSlftMihiSRdaVYlFEfelPgXbBc$5\' 4"culated) : 54.7 Heparin Dosing Weight: 76.7 kg  Vital Signs: Temp: 97.8 F (36.6 C) (11/17 1100) Temp Source: Oral (11/17 1100) BP: 161/99 mmHg (11/17 1100) Pulse Rate: 81 (11/17 1100)  Labs:  Recent Labs  03/23/15 1641 03/24/15 0251 03/24/15 0843 03/24/15 1049  HGB 16.3* 13.9  --   --   HCT 49.1* 41.7  --   --   PLT 119* 98*  --   --   APTT 30  --   --   --   LABPROT 13.9  --  14.4  --   INR 1.05  --  1.10  --   HEPARINUNFRC  --  0.25*  --  0.50  CREATININE 0.81 0.74  --   --   TROPONINI 0.66* 0.39* 0.36*  --     Estimated Creatinine Clearance: 82.1 mL/min (by C-G formula based on Cr of 0.74).   Medical History: History reviewed. No pertinent past medical history.  Assessment: Pharmacy consulted to dose heparin gtt in this 64 year old female presenting with shortness of breath and a troponin 0.66. Patient was not taking any anticoagulants prior to admission. Baseline labs have been obtained.   Goal of Therapy:  Heparin level 0.3-0.7 units/ml Monitor platelets by anticoagulation protocol: Yes   Plan:  Give 2000 units bolus x 1 Start heparin infusion at 900 units/hr Check anti-Xa level in 6 hours and daily while on heparin Continue to monitor H&H and platelets   11/17 03:00 heparin level 0.50. Continue rate of 1050 and get a conformation level in 6 hours.  Yann Biehn D Reese Stockman 03/24/2015,11:58 AM

## 2015-03-24 NOTE — Consult Note (Signed)
[facility] CLINIC CARDIOLOGY [facility]  CARDIOLOGY CONSULT NOTE  Patient ID: Audrey Murray MRN: [id number] DOB/AGE: 62-May-1952 62 y.o.  Admit date: 03/23/2015 Referring Physician Luberta Mutter   Primary Physician none Primary Cardiologist none Reason for Consultation abnormal troponin/sob  HPI: Pt with history of probable diabetes mellitus who sees no doctor regularly who was admitted after developing acute onset of sob 48 hours ago. Pt was doing well when she started an exercise program. She did well with the mild exercised. She later developed acute onset of sob. She has continued to have exertional tightness and sob that is making it impossible to ambulate. She has some tightness in her chest when she ambulates. She denies syncope or presyncope. Her serum glucose was 520 ion admission with normal potassium. She is somewhat improved but still sob   ROS Review of Systems - General ROS: positive for  - sob Respiratory ROS: positive for - shortness of breath Cardiovascular ROS: positive for - dyspnea on exertion Gastrointestinal ROS: no abdominal pain, change in bowel habits, or black or bloody stools Musculoskeletal ROS: negative Neurological ROS: no TIA or stroke symptoms   History reviewed. No pertinent past medical history.  History reviewed. No pertinent family history.  Social History   Social History  . Marital Status: Widowed    Spouse Name: N/A  . Number of Children: N/A  . Years of Education: N/A   Occupational History  . Not on file.   Social History Main Topics  . Smoking status: Never Smoker   . Smokeless tobacco: Not on file  . Alcohol Use: No  . Drug Use: No  . Sexual Activity: Not on file   Other Topics Concern  . Not on file   Social History Narrative  . No narrative on file    History reviewed. No pertinent past surgical history.   No prescriptions prior to admission    Physical Exam: Blood pressure 171/96, pulse 81, temperature 98 F  (36.7 C), temperature source Oral, resp. rate 16, height  (1.626 m), weight 96.163 kg (212 lb), SpO2 96 %.   General appearance: alert and cooperative Resp: diminished breath sounds bilaterally Chest wall: no tenderness Cardio: chest tightness GI: soft, non-tender; bowel sounds normal; no masses,  no organomegaly Extremities: extremities normal, atraumatic, no cyanosis or edema Neurologic: Grossly normal Labs:   Lab Results  Component Value Date   WBC 8.0 03/24/2015   HGB 13.9 03/24/2015   HCT 41.7 03/24/2015   MCV 92.1 03/24/2015   PLT 98* 03/24/2015    Recent Labs Lab 03/24/15 0251  NA 136  K 3.8  CL 105  CO2 23  BUN 12  CREATININE 0.74  CALCIUM 8.5*  PROT 5.7*  BILITOT 0.6  ALKPHOS 69  ALT 25  AST 22  GLUCOSE 384*   Lab Results  Component Value Date   TROPONINI 0.39* 03/24/2015      Radiology: No acute cardiopulmonary disease EKG: sinus tachycardia with no ischemia  ASSESSMENT AND PLAN:  64 yo female with probable dm with markedly elevated serum glucose on admission with elevated serum troponin that is slowly recovering. She had acute onset of sob 48 hours ago. No chf on cxr. Given diabetes, this may be anginal equivalent as this is an acute change in her status. WIll get echo today and continue to treat serum glucose. Based on echo will likely need cardaic cath in am to evaluate coronary arteries.  WOuld tonineu with asas, heparin, insulin, metoprolol and ace  i.  SignedDalia Heading: FATH,KENNETH A. MD, [facility] 03/24/2015, 8:42 AM

## 2015-03-24 NOTE — Progress Notes (Signed)
*  PRELIMINARY RESULTS* Echocardiogram 2D Echocardiogram has been performed.  Audrey Murray 03/24/2015, 4:50 PM

## 2015-03-25 ENCOUNTER — Encounter: Payer: Self-pay | Admitting: Cardiology

## 2015-03-25 ENCOUNTER — Encounter
Admission: EM | Disposition: A | Discharge status: Home / Self Care | Payer: Self-pay | Source: Home / Self Care | Attending: Internal Medicine

## 2015-03-25 HISTORY — PX: CARDIAC CATHETERIZATION: SHX172

## 2015-03-25 LAB — CBC WITH DIFFERENTIAL/PLATELET
BASOS ABS: 0.2 10*3/uL — AB (ref 0–0.1)
BASOS PCT: 3 %
Eosinophils Absolute: 0.1 10*3/uL (ref 0–0.7)
Eosinophils Relative: 2 %
HEMATOCRIT: 42.2 % (ref 35.0–47.0)
Hemoglobin: 14.1 g/dL (ref 12.0–16.0)
LYMPHS ABS: 2 10*3/uL (ref 1.0–3.6)
Lymphocytes Relative: 36 %
MCH: 30.6 pg (ref 26.0–34.0)
MCHC: 33.3 g/dL (ref 32.0–36.0)
MCV: 91.6 fL (ref 80.0–100.0)
MONO ABS: 0.4 10*3/uL (ref 0.2–0.9)
Monocytes Relative: 7 %
Neutro Abs: 2.8 10*3/uL (ref 1.4–6.5)
Neutrophils Relative %: 52 %
Platelets: 102 10*3/uL — ABNORMAL LOW (ref 150–440)
RBC: 4.6 MIL/uL (ref 3.80–5.20)
RDW: 13 % (ref 11.5–14.5)
WBC: 5.5 10*3/uL (ref 3.6–11.0)

## 2015-03-25 LAB — GLUCOSE, CAPILLARY
Glucose-Capillary: 312 mg/dL — ABNORMAL HIGH (ref 65–99)
Glucose-Capillary: 222 mg/dL — ABNORMAL HIGH (ref 65–99)
Glucose-Capillary: 112 mg/dL — ABNORMAL HIGH (ref 65–99)

## 2015-03-25 LAB — HEPARIN LEVEL (UNFRACTIONATED): HEPARIN UNFRACTIONATED: 0.66 [IU]/mL (ref 0.30–0.70)

## 2015-03-25 SURGERY — LEFT HEART CATH AND CORONARY ANGIOGRAPHY
Anesthesia: Moderate Sedation

## 2015-03-25 MED ORDER — FENTANYL CITRATE (PF) 100 MCG/2ML IJ SOLN
INTRAMUSCULAR | Status: DC | PRN
  Administered 2015-03-25: 50 ug via INTRAVENOUS

## 2015-03-25 MED ORDER — LISINOPRIL 10 MG PO TABS: 10.0000 mg | ORAL_TABLET | Freq: Every day | ORAL | Status: DC

## 2015-03-25 MED ORDER — INSULIN ASPART 100 UNIT/ML ~~LOC~~ SOLN
4.0000 [IU] | Freq: Three times a day (TID) | SUBCUTANEOUS | Status: DC
  Administered 2015-03-25 – 2015-03-26 (×3): 4 [IU] via SUBCUTANEOUS
  Filled 2015-03-25 (×3): qty 4

## 2015-03-25 MED ORDER — ACETAMINOPHEN 325 MG PO TABS: 650.0000 mg | ORAL_TABLET | ORAL | Status: DC | PRN

## 2015-03-25 MED ORDER — ONDANSETRON HCL 4 MG/2ML IJ SOLN: 4.0000 mg | Freq: Four times a day (QID) | INTRAMUSCULAR | Status: DC | PRN

## 2015-03-25 MED ORDER — NITROGLYCERIN 2 % TD OINT
TOPICAL_OINTMENT | TRANSDERMAL | Status: AC
  Filled 2015-03-25: qty 1

## 2015-03-25 MED ORDER — MIDAZOLAM HCL 2 MG/2ML IJ SOLN
INTRAMUSCULAR | Status: AC
  Filled 2015-03-25: qty 2

## 2015-03-25 MED ORDER — PRAVASTATIN SODIUM 40 MG PO TABS
40.0000 mg | ORAL_TABLET | Freq: Every day | ORAL | Status: DC
  Administered 2015-03-25: 40 mg via ORAL
  Filled 2015-03-25: qty 1

## 2015-03-25 MED ORDER — SODIUM CHLORIDE 0.9 % IJ SOLN: 3.0000 mL | INTRAMUSCULAR | Status: DC | PRN

## 2015-03-25 MED ORDER — SODIUM CHLORIDE 0.9 % IV SOLN: 250.0000 mL | INTRAVENOUS | Status: DC | PRN

## 2015-03-25 MED ORDER — MIDAZOLAM HCL 2 MG/2ML IJ SOLN
INTRAMUSCULAR | Status: DC | PRN
  Administered 2015-03-25: 1 mg via INTRAVENOUS

## 2015-03-25 MED ORDER — NITROGLYCERIN 2 % TD OINT
TOPICAL_OINTMENT | TRANSDERMAL | Status: DC | PRN
  Administered 2015-03-25: 1 [in_us] via TOPICAL

## 2015-03-25 MED ORDER — FENTANYL CITRATE (PF) 100 MCG/2ML IJ SOLN
INTRAMUSCULAR | Status: AC
  Filled 2015-03-25: qty 2

## 2015-03-25 MED ORDER — INSULIN STARTER KIT- SYRINGES (ENGLISH)
1.0000 | Freq: Once | Status: AC
  Administered 2015-03-25: 1
  Filled 2015-03-25: qty 1

## 2015-03-25 MED ORDER — HEPARIN (PORCINE) IN NACL 2-0.9 UNIT/ML-% IJ SOLN
INTRAMUSCULAR | Status: AC
  Filled 2015-03-25: qty 1000

## 2015-03-25 MED ORDER — HYDRALAZINE HCL 20 MG/ML IJ SOLN
INTRAMUSCULAR | Status: DC | PRN
  Administered 2015-03-25 (×2): 10 mg via INTRAVENOUS

## 2015-03-25 MED ORDER — SODIUM CHLORIDE 0.9 % WEIGHT BASED INFUSION: 3.0000 mL/kg/h | INTRAVENOUS | Status: AC

## 2015-03-25 MED ORDER — HYDRALAZINE HCL 20 MG/ML IJ SOLN
INTRAMUSCULAR | Status: AC
  Filled 2015-03-25: qty 1

## 2015-03-25 MED ORDER — SODIUM CHLORIDE 0.9 % IJ SOLN
3.0000 mL | Freq: Two times a day (BID) | INTRAMUSCULAR | Status: DC
  Administered 2015-03-25 – 2015-03-26 (×3): 3 mL via INTRAVENOUS

## 2015-03-25 MED ORDER — SODIUM CHLORIDE 0.9 % IV SOLN
INTRAVENOUS | Status: DC
  Administered 2015-03-25 – 2015-03-26 (×2): via INTRAVENOUS

## 2015-03-25 MED ORDER — IOHEXOL 300 MG/ML  SOLN
INTRAMUSCULAR | Status: DC | PRN
  Administered 2015-03-25: 110 mL via INTRA_ARTERIAL

## 2015-03-25 MED ORDER — INSULIN GLARGINE 100 UNIT/ML ~~LOC~~ SOLN
20.0000 [IU] | Freq: Every day | SUBCUTANEOUS | Status: DC
  Administered 2015-03-25: 20 [IU] via SUBCUTANEOUS
  Filled 2015-03-25 (×3): qty 0.2

## 2015-03-25 SURGICAL SUPPLY — 9 items
CATH INFINITI 5FR ANG PIGTAIL (CATHETERS) ×3 IMPLANT
CATH INFINITI 5FR JL4 (CATHETERS) ×3 IMPLANT
CATH INFINITI JR4 5F (CATHETERS) ×3 IMPLANT
DEVICE CLOSURE MYNXGRIP 5F (Vascular Products) ×3 IMPLANT
KIT MANI 3VAL PERCEP (MISCELLANEOUS) ×3 IMPLANT
NEEDLE PERC 18GX7CM (NEEDLE) ×3 IMPLANT
PACK CARDIAC CATH (CUSTOM PROCEDURE TRAY) ×3 IMPLANT
SHEATH AVANTI 5FR X 11CM (SHEATH) ×3 IMPLANT
WIRE EMERALD 3MM-J .035X150CM (WIRE) ×3 IMPLANT

## 2015-03-25 NOTE — Progress Notes (Signed)
ANTICOAGULATION CONSULT NOTE - Initial Consult  Pharmacy Consult for heparin gtt  Indication: chest pain/ACS  No Known Allergies  Patient Measurements: Height: 5\' 4"  (162.6 cm) Weight: 212 lb (96.163 kg) IBW/kg (Calculated) : 54.7 Heparin Dosing Weight: 76.7 kg  Vital Signs: Temp: 98.2 F (36.8 C) (11/18 0456) Temp Source: Oral (11/18 0456) BP: 156/80 mmHg (11/18 0456) Pulse Rate: 79 (11/18 0456)  Labs:  Recent Labs  03/23/15 1641  03/24/15 0251 03/24/15 0843 03/24/15 1049 03/24/15 1502 03/25/15 0412  HGB 16.3*  --  13.9  --   --   --  14.1  HCT 49.1*  --  41.7  --   --   --  42.2  PLT 119*  --  98*  --   --   --  102*  APTT 30  --   --   --   --   --   --   LABPROT 13.9  --   --  14.4  --   --   --   INR 1.05  --   --  1.10  --   --   --   HEPARINUNFRC  --   < > 0.25*  --  0.50 0.40 0.66  CREATININE 0.81  --  0.74  --   --   --   --   TROPONINI 0.66*  --  0.39* 0.36*  --   --   --   < > = values in this interval not displayed.  Estimated Creatinine Clearance: 82.1 mL/min (by C-G formula based on Cr of 0.74).   Medical History: History reviewed. No pertinent past medical history.  Assessment: Pharmacy consulted to dose heparin gtt in this 64 year old female presenting with shortness of breath and a troponin 0.66. Patient was not taking any anticoagulants prior to admission. Baseline labs have been obtained.   Goal of Therapy:  Heparin level 0.3-0.7 units/ml Monitor platelets by anticoagulation protocol: Yes   Plan:  Give 2000 units bolus x 1 Start heparin infusion at 900 units/hr Check anti-Xa level in 6 hours and daily while on heparin Continue to monitor H&H and platelets   11/17 11:00 heparin level 0.50. Continue rate of 1050 and get a conformation level in 6 hours.  11/17 heparin level @ 1500 resulted therapeutic at 0.4 units/mL. Will continue with current rate of 1050 units/hr and will order heparin level with AM labs tomorrow.   11/18 AM heparin  level 0.66. Recheck with CBC tomorrow AM.  Audrey Murray 03/25/2015,5:37 AM

## 2015-03-25 NOTE — Progress Notes (Signed)
Report to [facility] telemetry.  Check right groin for bleeding or hematoma.  Patient will be on bedrest for 1 hours post sheath pull---out of bed at 0915  Bilateral pulses are2's DP's..Marland Kitchen

## 2015-03-25 NOTE — Care Management CHF Note (Signed)
Order present for CM assessment.  No needs identified other than lack of PCP.  Provided with list of local physicians taking new patients.

## 2015-03-25 NOTE — Progress Notes (Signed)
Patient was able to practice with lancet and sticking herself for blood sugar. Patient was also instructed and demonstrated how to drawn up insulin and self administer insulin in abdomen.

## 2015-03-25 NOTE — Progress Notes (Signed)
[facility] [place] [facility]   PATIENT NAME: Audrey Murray    MR#:  [id number]  DATE OF BIRTH:  November 09, 1950  SUBJECTIVE: 64 year old female female patient came in because of acute onset of shortness of breath. Her blood pressure was 222/124. Patient right now has no complaints. No chest pain, no shortness of breath. BG : Up to 359. No history of diabetes, hypertension. Troponin 0.39    CHIEF COMPLAINT:   Chief Complaint  Patient presents with  . Shortness of Breath   No complaints. Seen after cardiac catheterization.  REVIEW OF SYSTEMS:    Review of Systems  Constitutional: Negative for fever and chills.  HENT: Negative for hearing loss.   Eyes: Negative for blurred vision, double vision and photophobia.  Respiratory: Negative for cough, hemoptysis and shortness of breath.   Cardiovascular: Negative for palpitations, orthopnea and leg swelling.  Gastrointestinal: Negative for vomiting, abdominal pain and diarrhea.  Genitourinary: Negative for dysuria and urgency.  Musculoskeletal: Negative for myalgias and neck pain.  Skin: Negative for rash.  Neurological: Negative for dizziness, focal weakness, seizures, weakness and headaches.  Psychiatric/Behavioral: Negative for memory loss. The patient does not have insomnia.     Nutrition:  Tolerating Diet: Tolerating PT:      DRUG ALLERGIES:  No Known Allergies  VITALS:  Blood pressure 158/83, pulse 97, temperature 97.7 F (36.5 C), temperature source Oral, resp. rate 18, height 5\' 4"  (1.626 m), weight 96.163 kg (212 lb), SpO2 98 %.  PHYSICAL EXAMINATION:   Physical Exam  GENERAL:  64 y.o.-year-old patient lying in the bed with no acute distress.  LUNGS: Normal breath sounds bilaterally, no wheezing, rales,rhonchi or crepitation. No use of accessory muscles of respiration.  CARDIOVASCULAR: S1, S2 normal. No murmurs, rubs, or gallops.  ABDOMEN: Soft, nontender, nondistended. Bowel sounds  present. No organomegaly or mass.  EXTREMITIES: No pedal edema, cyanosis, or clubbing.  NEUROLOGIC: Cranial nerves II through XII are intact. Muscle strength 5/5 in all extremities. Sensation intact. Gait not checked.  PSYCHIATRIC: The patient is alert and oriented x 3.  SKIN: No obvious rash, lesion, or ulcer.    LABORATORY PANEL:   CBC  Recent Labs Lab 03/25/15 0412  WBC 5.5  HGB 14.1  HCT 42.2  PLT 102*   ------------------------------------------------------------------------------------------------------------------  Chemistries   Recent Labs Lab 03/24/15 0251  NA 136  K 3.8  CL 105  CO2 23  GLUCOSE 384*  BUN 12  CREATININE 0.74  CALCIUM 8.5*  AST 22  ALT 25  ALKPHOS 69  BILITOT 0.6   ------------------------------------------------------------------------------------------------------------------  Cardiac Enzymes  Recent Labs Lab 03/24/15 0843  TROPONINI 0.36*   ------------------------------------------------------------------------------------------------------------------  RADIOLOGY:  No results found.   ASSESSMENT AND PLAN:   Active Problems:   Non-STEMI (non-ST elevated myocardial infarction) (HCC)   1  Hypertensive urgency with acute shortness of breath: Blood pressure fairly well controlled at this time. Continue lisinopril and metoprolol.  2.NSTEMI; likely strain due to hypertension. She has had a cardiac catheterization today which shows clean cardiac arteries. Continue statin, beta blocker, aspirin. Discontinue heparin. Due to very elevated blood sugars and contrast load of the catheterization today I would like to keep her overnight to ensure her renal function remains stable.   #3 diabetes mellitus: New diagnosis. Hemoglobin A1c greater than 15! Continue Lantus, NovoLog 5 units 3 times a day and the NovoLog correction also. Has received diabetic education. Needs a primary care provider   #4 hyperlipidemia and LDL more than  113.  Continue statins.  All the records are reviewed and case discussed with Care Management/Social Workerr. Management plans discussed with the patient, family and they are in agreement.  CODE STATUS:full  TOTAL TIME TAKING CARE OF THIS PATIENT: 35 minutes.   POSSIBLE D/C IN 2DAYS, DEPENDING ON CLINICAL CONDITION.   Elby Showers M.D on 03/25/2015 at 5:04 PM  Between 7am to 6pm - Pager - [phone]  After 6pm go to [email or web address] - password [email or web address] [facility]  [place] [facility]  Office  [phone]  CC: Primary care physician; No PCP Per Patient

## 2015-03-25 NOTE — Progress Notes (Signed)
Patient has rested quietly tonight. No complaints of pain and no signs of discomfort or distress noted. Patient has been NPO since midnight in preparation for cardiac cath scheduled for later this morning. Heparin infusion still running at 10.5 mL/hr. Nursing staff will continue to monitor. Lamonte RicherKara A Vivek Grealish, RN

## 2015-03-25 NOTE — Progress Notes (Signed)
Inpatient Diabetes Program Recommendations  AACE/ADA: New Consensus Statement on Inpatient Glycemic Control (2015)  Target Ranges:  Prepandial:   less than 140 mg/dL      Peak postprandial:   less than 180 mg/dL (1-2 hours)      Critically ill patients:  140 - 180 mg/dL    Results for RUKIYA, HODGKINS (MRN [id number]) as of 03/25/2015 13:36  Ref. Range 03/24/2015 07:50 03/24/2015 12:07 03/24/2015 17:31 03/24/2015 21:38  Glucose-Capillary Latest Ref Range: 65-99 mg/dL 323 (H) 310 (H) 152 (H) 234 (H)    Results for NONIE, LOCHNER (MRN [id number]) as of 03/25/2015 13:36  Ref. Range 03/24/2015 16:13  Hemoglobin A1C Latest Ref Range: 4.0-6.0 % 15.7 (H)    Current Insulin Orders: Lantus 10 units QHS      Novolog Sensitive SSI (0-9 units) TID AC + HS    MD- Please consider the following in-hospital insulin adjustments:  1. Increase Lantus to 20 units QHS (0.2 units/kg dosing)  2. Start Novolog Meal Coverage- Novolog 4 units tid with meals  3. Consider sending patient home with Lantus and Novolog- Patient is being taught how to use insulin syringes and insulin pens.  Will ask Care management to check on affordability of insulin pens.  With patient's A1c being so high (15.7%), patient likely needs insulin (at least in the short term) to help manage CBGs at home.  Could also consider starting patient on Metformin 500 mg bid in addition to insulin at home.  Note patient does not have PCP.  Will ask Care manager to see to assist her in finding PCP at time of d/c.  4. If you decide to send pt home on insulin pens, please use the following Order numbers when placing Rxs orders: Lantus insulin pen [Order #[id number]] Novolog insulin pen [Order # [id number]] Insulin Pen Needles [Order # [id number]] CBG Meter and supplies [Order # [id number]]    -Spoke with pt about new diagnosis.  Discussed A1C results with her and explained what an A1C is, basic pathophysiology of DM Type 2, basic home care, basic  diabetes diet nutrition principles, importance of checking CBGs and maintaining good CBG control to prevent long-term and short-term complications.  Reviewed signs and symptoms of hyperglycemia and hypoglycemia and how to treat hypoglycemia at home.  Also reviewed blood sugar goals at home.    -RNs to provide ongoing basic DM education at bedside with this patient.  Have ordered educational booklet, insulin starter kit.  RD has visited with patient as well and provided DM diet education for this patient.  -RNs working with patient on drawing up insulin and giving insulin injections with vial and syringe.    -Patient has also been educated on how to use insulin pens.  Reviewed contents of insulin flexpen starter kit.  Reviewed all steps of insulin pen including attachment of needle, 2-unit air shot, dialing up dose, giving injection, removing needle, disposal of sharps, storage of unused insulin, disposal of insulin etc.  Patient able to provide successful return demonstration.  Reviewed troubleshooting with insulin pen.  Also reviewed Signs/Symptoms of Hypoglycemia with patient and how to treat Hypoglycemia at home.  Have asked RNs caring for patient to please allow patient to give all injections here in hospital as much as possible for practice.  MD to give patient Rxs for insulin pens and insulin pen needles.       --Will follow patient during hospitalization--  Wyn Quaker RN, MSN, CDE Diabetes Coordinator Inpatient Glycemic Control Team  Team Pager: [phone] (8a-5p)

## 2015-03-25 NOTE — Care Management (Deleted)
Patient does not engage in conversation with CM.  Does not hold eye contact.  Patient says that the PCP on her medicaid [place] Access card is deceased.  [facility] on [address]  Discussed with that of the need for her to call medicaid to request change in PCP.  Verbalized understanding.  She denies issues obtaining medication  or transportation.  Unable to confirm if these are reliable answers.  Patient does not appear to present frequently.  She is also followed by [facility].  For cardiac cath today

## 2015-03-25 NOTE — Progress Notes (Signed)
Patient up to bsc. Gait steady. Dressing to right groin dry and intact no bleeding no hematoma noted. Will continue to monitor closely.

## 2015-03-26 DIAGNOSIS — E119 Type 2 diabetes mellitus without complications: Secondary | ICD-10-CM

## 2015-03-26 DIAGNOSIS — I10 Essential (primary) hypertension: Secondary | ICD-10-CM

## 2015-03-26 DIAGNOSIS — Z794 Long term (current) use of insulin: Secondary | ICD-10-CM

## 2015-03-26 DIAGNOSIS — E785 Hyperlipidemia, unspecified: Secondary | ICD-10-CM

## 2015-03-26 LAB — CBC
HCT: 40.2 % (ref 35.0–47.0)
Hemoglobin: 13.6 g/dL (ref 12.0–16.0)
MCH: 31 pg (ref 26.0–34.0)
MCHC: 33.8 g/dL (ref 32.0–36.0)
MCV: 91.5 fL (ref 80.0–100.0)
PLATELETS: 93 10*3/uL — AB (ref 150–440)
RBC: 4.39 MIL/uL (ref 3.80–5.20)
RDW: 12.8 % (ref 11.5–14.5)
WBC: 4.9 10*3/uL (ref 3.6–11.0)

## 2015-03-26 LAB — GLUCOSE, CAPILLARY
Glucose-Capillary: 226 mg/dL — ABNORMAL HIGH (ref 65–99)
Glucose-Capillary: 237 mg/dL — ABNORMAL HIGH (ref 65–99)

## 2015-03-26 LAB — BASIC METABOLIC PANEL
Anion gap: 8 (ref 5–15)
BUN: 11 mg/dL (ref 6–20)
CALCIUM: 8.5 mg/dL — AB (ref 8.9–10.3)
CO2: 21 mmol/L — ABNORMAL LOW (ref 22–32)
CREATININE: 0.76 mg/dL (ref 0.44–1.00)
Chloride: 108 mmol/L (ref 101–111)
GFR calc Af Amer: >60 mL/min (ref >60)
Glucose, Bld: 264 mg/dL — ABNORMAL HIGH (ref 65–99)
Potassium: 3.4 mmol/L — ABNORMAL LOW (ref 3.5–5.1)
SODIUM: 137 mmol/L (ref 135–145)

## 2015-03-26 MED ORDER — LISINOPRIL 20 MG PO TABS: 20.0000 mg | ORAL_TABLET | Freq: Every day | ORAL | Status: DC

## 2015-03-26 MED ORDER — INSULIN GLARGINE 100 UNIT/ML SOLOSTAR PEN: 20.0000 [IU] | PEN_INJECTOR | Freq: Every day | SUBCUTANEOUS | Status: DC

## 2015-03-26 MED ORDER — ASPIRIN 81 MG PO TBEC: 81.0000 mg | DELAYED_RELEASE_TABLET | Freq: Every day | ORAL | Status: AC

## 2015-03-26 MED ORDER — BLOOD GLUCOSE MONITOR KIT: PACK | Status: AC

## 2015-03-26 MED ORDER — INSULIN ASPART 100 UNIT/ML FLEXPEN: 1.0000 [IU] | PEN_INJECTOR | Freq: Three times a day (TID) | SUBCUTANEOUS | Status: DC

## 2015-03-26 MED ORDER — METFORMIN HCL 500 MG PO TABS: 500.0000 mg | ORAL_TABLET | Freq: Two times a day (BID) | ORAL | Status: DC

## 2015-03-26 MED ORDER — POTASSIUM CHLORIDE CRYS ER 20 MEQ PO TBCR
40.0000 meq | EXTENDED_RELEASE_TABLET | Freq: Once | ORAL | Status: AC
  Administered 2015-03-26: 40 meq via ORAL
  Filled 2015-03-26: qty 2

## 2015-03-26 MED ORDER — LISINOPRIL 20 MG PO TABS
20.0000 mg | ORAL_TABLET | Freq: Every day | ORAL | Status: DC
  Administered 2015-03-26: 20 mg via ORAL
  Filled 2015-03-26: qty 1

## 2015-03-26 MED ORDER — INSULIN PEN NEEDLE 29G X 10MM MISC: 1.0000 [IU] | Freq: Every day | Status: DC

## 2015-03-26 MED ORDER — PRAVASTATIN SODIUM 40 MG PO TABS: 40.0000 mg | ORAL_TABLET | Freq: Every day | ORAL | Status: DC

## 2015-03-26 MED ORDER — METOPROLOL TARTRATE 25 MG PO TABS: 25.0000 mg | ORAL_TABLET | Freq: Two times a day (BID) | ORAL | Status: DC

## 2015-03-26 NOTE — Progress Notes (Signed)
Discharge instructions along with vascular discharge instructions given to patient and gone over in great detail.work note also given to patient.  [employer] closure brochure also given to patient. Patient was given glucose meter,  Proper use was gone over with patient and patient verbally explain how to use meter. Sliding scale range given to patient. Made patient aware rx was electronically submitted to pharmacy. Instructed patient that she will have to get her own strips for the meter. Patient verbalized that she understood. Iv removed x2. Telemetry removed and returned. Patient to be discharged home no distress noted. No c/o pain. Family to transport patient.

## 2015-03-26 NOTE — Progress Notes (Signed)
[facility]         [place], [place]   03/26/2015  Patient: Audrey Murray   Date of Birth:  16-Apr-1951  Date of admission:  03/23/2015  Date of Discharge  03/26/2015    To Whom it May Concern:   Audrey Murray  may return to work on 03/28/2015.  No restrictions  If you have any questions or concerns, please don't hesitate to call.  Sincerely,   Elby ShowersWALSH, CATHERINE M.D Pager [phone]- [phone] Office : [phone]   .

## 2015-03-26 NOTE — Discharge Summary (Signed)
[facility]   PATIENT NAME: Audrey Murray    MR#:  [id number]  DATE OF BIRTH:  August 09, 1950  DATE OF ADMISSION:  03/23/2015 ADMITTING PHYSICIAN: Nicholes Mango, MD  DATE OF DISCHARGE: 03/26/2015  PRIMARY CARE PHYSICIAN: No PCP Per Patient    ADMISSION DIAGNOSIS:  Dyspnea [R06.00] Hyperglycemia [R73.9] NSTEMI (non-ST elevated myocardial infarction) ([place]) [I21.4] Uncontrolled hypertension [I10]  DISCHARGE DIAGNOSIS:  Active Problems:   Non-STEMI (non-ST elevated myocardial infarction) ([place])   Diabetes ([place])   Essential hypertension   Hyperlipidemia LDL goal <100   SECONDARY DIAGNOSIS:  History reviewed. No pertinent past medical history.  HOSPITAL COURSE:    1 Hypertensive urgency with acute shortness of breath: Blood pressure fairly well controlled at this time. Continue lisinopril and metoprolol.  2.NSTEMI; likely strain due to hypertension. She has had a cardiac catheterization today which shows clean cardiac arteries. Continue statin, beta blocker, aspirin.   #3 diabetes mellitus: New diagnosis. Hemoglobin A1c greater than 15! Continue Lantus, NovoLog correction and start metformin. Has received diabetic education. Needs a primary care provider ASAP for follow up.   #4 hyperlipidemia and LDL more than 113. Continue statin.  #5 on going tobacco abuse: counseling provided  DISCHARGE CONDITIONS:   fair  CONSULTS OBTAINED:  Treatment Team:  Nicholes Mango, MD Teodoro Spray, MD  DRUG ALLERGIES:  No Known Allergies  DISCHARGE MEDICATIONS:   Current Discharge Medication List    START taking these medications   Details  aspirin EC 81 MG EC tablet Take 1 tablet (81 mg total) by mouth daily. Qty: 30 tablet, Refills: 0    blood glucose meter kit and supplies KIT Dispense based on patient and insurance preference. Use up to four times daily as directed. (FOR ICD-9 250.00, 250.01). Qty: 1 each,  Refills: 0    insulin aspart (NOVOLOG FLEXPEN) 100 UNIT/ML FlexPen Inject 1-9 Units into the skin 3 (three) times daily with meals. Qty: 15 mL, Refills: 11    Insulin Glargine (LANTUS) 100 UNIT/ML Solostar Pen Inject 20 Units into the skin daily at 10 pm. Qty: 15 mL, Refills: 11    Insulin Pen Needle 29G X 10MM MISC 1 Units by Does not apply route 5 (five) times daily. Qty: 150 each, Refills: 0    lisinopril (PRINIVIL,ZESTRIL) 20 MG tablet Take 1 tablet (20 mg total) by mouth daily. Qty: 30 tablet, Refills: 0    metFORMIN (GLUCOPHAGE) 500 MG tablet Take 1 tablet (500 mg total) by mouth 2 (two) times daily with a meal. Qty: 60 tablet, Refills: 0    metoprolol tartrate (LOPRESSOR) 25 MG tablet Take 1 tablet (25 mg total) by mouth 2 (two) times daily. Qty: 60 tablet, Refills: 0    pravastatin (PRAVACHOL) 40 MG tablet Take 1 tablet (40 mg total) by mouth daily at 6 PM. Qty: 30 tablet, Refills: 0         DISCHARGE INSTRUCTIONS:     DIET:  Cardiac diet, Diabetic diet and Low fat, Low cholesterol diet  DISCHARGE CONDITION:  Fair  ACTIVITY:  Activity as tolerated  OXYGEN:  Home Oxygen: No.   Oxygen Delivery: room air  DISCHARGE LOCATION:  home     If you experience worsening of your admission symptoms, develop shortness of breath, life threatening emergency, suicidal or homicidal thoughts you must seek medical attention immediately by calling 911 or calling your MD immediately  if symptoms less severe.  You Must read complete instructions/literature along with  all the possible adverse reactions/side effects for all the Medicines you take and that have been prescribed to you. Take any new Medicines after you have completely understood and accept all the possible adverse reactions/side effects.   Please note  You were cared for by a hospitalist during your hospital stay. If you have any questions about your discharge medications or the care you received while you were in  the hospital after you are discharged, you can call the unit and asked to speak with the hospitalist on call if the hospitalist that took care of you is not available. Once you are discharged, your primary care physician will handle any further medical issues. Please note that NO REFILLS for any discharge medications will be authorized once you are discharged, as it is imperative that you return to your primary care physician (or establish a relationship with a primary care physician if you do not have one) for your aftercare needs so that they can reassess your need for medications and monitor your lab values.    Today   CHIEF COMPLAINT:   Chief Complaint  Patient presents with  . Shortness of Breath    HISTORY OF PRESENT ILLNESS:  Audrey Murray is a 64 y.o. female with no medical problems is new she was not seen by any doctors in the past 15 years is presenting to the ED with a chief complaint of shortness of breath after exercising yesterday. Patient is reporting frequent urination and being thirsty. Denies any chest pain or nausea or vomiting. Patient's blood sugar was at 520 and troponin is elevated 0.66 with no acute ST-T wave changes on EKG. Patient denies any dizziness or loss of consciousness  VITAL SIGNS:  Blood pressure 187/104, pulse 73, temperature 97.9 F (36.6 C), temperature source Oral, resp. rate 16, height _0  (1.626 m), weight 96.163 kg (212 lb), SpO2 97 %.  I/O:   Intake/Output Summary (Last 24 hours) at 03/26/15 0828 Last data filed at 03/26/15 0827  Gross per 24 hour  Intake 2512.5 ml  Output   2625 ml  Net -112.5 ml    PHYSICAL EXAMINATION:  GENERAL:  64 y.o.-year-old patient lying in the bed with no acute distress.  EYES: Pupils equal, round, reactive to light and accommodation. No scleral icterus. Extraocular muscles intact.  HEENT: Head atraumatic, normocephalic. Oropharynx and nasopharynx clear.  NECK:  Supple, no jugular venous distention. No  thyroid enlargement, no tenderness.  LUNGS: Normal breath sounds bilaterally, no wheezing, rales,rhonchi or crepitation. No use of accessory muscles of respiration.  CARDIOVASCULAR: S1, S2 normal. No murmurs, rubs, or gallops.  ABDOMEN: Soft, non-tender, non-distended. Bowel sounds present. No organomegaly or mass.  EXTREMITIES: No pedal edema, cyanosis, or clubbing.  NEUROLOGIC: Cranial nerves II through XII are intact. Muscle strength 5/5 in all extremities. Sensation intact. Gait not checked.  PSYCHIATRIC: The patient is alert and oriented x 3.  SKIN: No obvious rash, lesion, or ulcer.   DATA REVIEW:   CBC  Recent Labs Lab 03/26/15 0424  WBC 4.9  HGB 13.6  HCT 40.2  PLT 93*    Chemistries   Recent Labs Lab 03/24/15 0251 03/26/15 0424  NA 136 137  K 3.8 3.4*  CL 105 108  CO2 23 21*  GLUCOSE 384* 264*  BUN 12 11  CREATININE 0.74 0.76  CALCIUM 8.5* 8.5*  AST 22  --   ALT 25  --   ALKPHOS 69  --   BILITOT 0.6  --  Cardiac Enzymes  Recent Labs Lab 03/24/15 0843  TROPONINI 0.36*    Microbiology Results  No results found for this or any previous visit.  RADIOLOGY:  No results found.  EKG:   Orders placed or performed during the hospital encounter of 03/23/15  . ED EKG  . ED EKG  . EKG 12-Lead  . EKG 12-Lead      Management plans discussed with the patient, family and they are in agreement.  CODE STATUS:     Code Status Orders        Start     Ordered   03/25/15 0944  Full code   Continuous     03/25/15 0943      TOTAL TIME TAKING CARE OF THIS PATIENT: 35 minutes.  Greater than 50% of time spent in care coordination and counseling.  Myrtis Ser M.D on 03/26/2015 at 8:28 AM  Between 7am to 6pm - Pager - [phone]  After 6pm go to [email or web address] - password [email or web address] [facility]  [facility]  Office  [phone]  CC: Primary care physician; No PCP Per Patient

## 2015-03-26 NOTE — Discharge Instructions (Signed)
°  DIET:  °Cardiac diet, Diabetic diet and Low fat, Low cholesterol diet ° °DISCHARGE CONDITION:  °Fair ° °ACTIVITY:  °Activity as tolerated ° °OXYGEN:  °Home Oxygen: No. °  °Oxygen Delivery: room air ° °DISCHARGE LOCATION:  °home  ° °If you experience worsening of your admission symptoms, develop shortness of breath, life threatening emergency, suicidal or homicidal thoughts you must seek medical attention immediately by calling 911 or calling your MD immediately  if symptoms less severe. ° °You Must read complete instructions/literature along with all the possible adverse reactions/side effects for all the Medicines you take and that have been prescribed to you. Take any new Medicines after you have completely understood and accpet all the possible adverse reactions/side effects.  ° °Please note ° °You were cared for by a hospitalist during your hospital stay. If you have any questions about your discharge medications or the care you received while you were in the hospital after you are discharged, you can call the unit and asked to speak with the hospitalist on call if the hospitalist that took care of you is not available. Once you are discharged, your primary care physician will handle any further medical issues. Please note that NO REFILLS for any discharge medications will be authorized once you are discharged, as it is imperative that you return to your primary care physician (or establish a relationship with a primary care physician if you do not have one) for your aftercare needs so that they can reassess your need for medications and monitor your lab values. ° ° °

## 2015-04-11 ENCOUNTER — Encounter: Payer: Self-pay | Admitting: Family Medicine

## 2015-04-11 ENCOUNTER — Ambulatory Visit (INDEPENDENT_AMBULATORY_CARE_PROVIDER_SITE_OTHER): Payer: PRIVATE HEALTH INSURANCE | Admitting: Family Medicine

## 2015-04-11 VITALS — BP 142/92 | HR 81 | Temp 98.0°F | Resp 14 | Ht 64.0 in | Wt 210.1 lb

## 2015-04-11 DIAGNOSIS — I214 Non-ST elevation (NSTEMI) myocardial infarction: Secondary | ICD-10-CM

## 2015-04-11 DIAGNOSIS — E119 Type 2 diabetes mellitus without complications: Secondary | ICD-10-CM | POA: Diagnosis not present

## 2015-04-11 DIAGNOSIS — Z794 Long term (current) use of insulin: Secondary | ICD-10-CM | POA: Diagnosis not present

## 2015-04-11 DIAGNOSIS — I10 Essential (primary) hypertension: Secondary | ICD-10-CM

## 2015-04-11 LAB — GLUCOSE, POCT (MANUAL RESULT ENTRY): POC Glucose: 94 mg/dl (ref 70–99)

## 2015-04-11 LAB — POCT GLYCOSYLATED HEMOGLOBIN (HGB A1C): HEMOGLOBIN A1C: 13.9

## 2015-04-11 MED ORDER — LISINOPRIL 40 MG PO TABS: 40.0000 mg | ORAL_TABLET | Freq: Every day | ORAL | Status: DC

## 2015-04-11 NOTE — Progress Notes (Signed)
Name: Audrey Murray   MRN: [id number]    DOB: 14-Apr-1951   Date:04/11/2015       Progress Note  Subjective  Chief Complaint  Chief Complaint  Patient presents with  . New Patient (Initial Visit)    went the ER and has been newly diagnosed with the following:  . Diabetes    Checks Blood glucose before meals 3X day low-98, high-235  . Hypertension  . Hyperlipidemia    Diabetes She presents for her follow-up diabetic visit. She has type 2 diabetes mellitus. Her disease course has been stable. There are no hypoglycemic associated symptoms. Pertinent negatives for hypoglycemia include no headaches. Associated symptoms include blurred vision. Pertinent negatives for diabetes include no chest pain, no fatigue, no foot paresthesias, no polydipsia and no polyuria. (Had blurred vision, frequent urination, and thirst before medications, improved when started on Insulin and Metformin.) Pertinent negatives for diabetic complications include no CVA. Current diabetic treatment includes oral agent (monotherapy) and intensive insulin program. She is currently taking insulin pre-breakfast, pre-lunch, pre-dinner and at bedtime (sliding scale insulin.). Insulin injections are given by patient. Rotation sites for injection include the abdominal wall. Her weight is stable. She is following a diabetic diet. She has had a previous visit with a dietitian (in the hospital). She rarely participates in exercise. Her breakfast blood glucose range is generally 110-130 mg/dl. An ACE inhibitor/angiotensin II receptor blocker is being taken. She does not see a podiatrist.Eye exam is current.  Hypertension This is a new problem. The problem is uncontrolled. Associated symptoms include blurred vision. Pertinent negatives include no chest pain, headaches, neck pain, palpitations or shortness of breath. Risk factors for coronary artery disease include diabetes mellitus, dyslipidemia and obesity. Past treatments include ACE  inhibitors and beta blockers. Hypertensive end-organ damage includes CAD/MI. There is no history of kidney disease or CVA.  Hyperlipidemia This is a new problem. The problem is uncontrolled. Recent lipid tests were reviewed and are high. Exacerbating diseases include diabetes and obesity. Pertinent negatives include no chest pain, leg pain, myalgias or shortness of breath. Current antihyperlipidemic treatment includes statins. The current treatment provides moderate improvement of lipids.     Past Medical History  Diagnosis Date  . Hypertension   . Diabetes mellitus without complication ([place])   . Hyperlipidemia   . Heart attack [facility])     Past Surgical History  Procedure Laterality Date  . Cardiac catheterization N/A 03/25/2015    Procedure: Left Heart Cath and Coronary Angiography;  Surgeon: Teodoro Spray, MD;  Location: [facility];  Service: Cardiovascular;  Laterality: N/A;    Family History  Problem Relation Age of Onset  . Stroke Mother   . Diabetes Mother   . Stroke Father   . Hypertension Son   . Asthma Son     Social History   Social History  . Marital Status: Widowed    Spouse Name: N/A  . Number of Children: N/A  . Years of Education: N/A   Occupational History  . Not on file.   Social History Main Topics  . Smoking status: Never Smoker   . Smokeless tobacco: Not on file  . Alcohol Use: No  . Drug Use: No  . Sexual Activity: No   Other Topics Concern  . Not on file   Social History Narrative     Current outpatient prescriptions:  .  insulin lispro protamine-lispro (HUMALOG 50/50 MIX) (50-50) 100 UNIT/ML SUSP injection, Inject into the skin 2 (two) times  daily before a meal., Disp: , Rfl:  .  aspirin EC 81 MG EC tablet, Take 1 tablet (81 mg total) by mouth daily., Disp: 30 tablet, Rfl: 0 .  blood glucose meter kit and supplies KIT, Dispense based on patient and insurance preference. Use up to four times daily as directed. (FOR ICD-9 250.00,  250.01)., Disp: 1 each, Rfl: 0 .  Insulin Glargine (LANTUS) 100 UNIT/ML Solostar Pen, Inject 20 Units into the skin daily at 10 pm., Disp: 15 mL, Rfl: 11 .  Insulin Pen Needle 29G X 10MM MISC, 1 Units by Does not apply route 5 (five) times daily., Disp: 150 each, Rfl: 0 .  lisinopril (PRINIVIL,ZESTRIL) 20 MG tablet, Take 1 tablet (20 mg total) by mouth daily., Disp: 30 tablet, Rfl: 0 .  metFORMIN (GLUCOPHAGE) 500 MG tablet, Take 1 tablet (500 mg total) by mouth 2 (two) times daily with a meal., Disp: 60 tablet, Rfl: 0 .  metoprolol tartrate (LOPRESSOR) 25 MG tablet, Take 1 tablet (25 mg total) by mouth 2 (two) times daily., Disp: 60 tablet, Rfl: 0 .  pravastatin (PRAVACHOL) 40 MG tablet, Take 1 tablet (40 mg total) by mouth daily at 6 PM., Disp: 30 tablet, Rfl: 0  No Known Allergies   Review of Systems  Constitutional: Negative for fatigue.  Eyes: Positive for blurred vision. Negative for double vision.  Respiratory: Negative for shortness of breath.   Cardiovascular: Negative for chest pain and palpitations.  Gastrointestinal: Negative for nausea, vomiting and abdominal pain.  Musculoskeletal: Negative for myalgias and neck pain.  Neurological: Negative for headaches.  Endo/Heme/Allergies: Negative for polydipsia.    Objective  Filed Vitals:   04/11/15 1139  BP: 142/92  Pulse: 81  Temp: 98 F (36.7 C)  TempSrc: Oral  Resp: 14  Height: 5' 4"  (1.626 m)  Weight: 210 lb 1.6 oz (95.301 kg)  SpO2: 98%    Physical Exam  Constitutional: She is well-developed, well-nourished, and in no distress.  Eyes: Pupils are equal, round, and reactive to light.  Cardiovascular: Normal rate, regular rhythm and normal heart sounds.   Pulmonary/Chest: Effort normal and breath sounds normal. She has no wheezes.  Abdominal: Soft. Bowel sounds are normal.  Musculoskeletal:       Right ankle: She exhibits no swelling.       Left ankle: She exhibits swelling.  Skin: Skin is warm and dry.  Nursing  note and vitals reviewed.  Assessment & Plan  1. Non-STEMI (non-ST elevated myocardial infarction) [facility]) Hospital records reviewed.  - Lipid Profile - Ambulatory referral to Cardiology  2. Essential hypertension Increased Lisinopril to 40 mg daily for elevated BP. Recheck in 1 mo. - lisinopril (PRINIVIL,ZESTRIL) 40 MG tablet; Take 1 tablet (40 mg total) by mouth daily.  Dispense: 90 tablet; Refill: 0  3. Type 2 diabetes mellitus treated with insulin (HCC)A1c improved after pharmacotherapy was started during hospitalization. Referral to Endocrinology. - POCT HgB A1C - POCT Glucose (CBG) - Comprehensive Metabolic Panel (CMET) - Ambulatory referral to Endocrinology   Joslyne Marshburn [facility] 04/11/2015 11:56 AM

## 2015-04-12 LAB — LIPID PANEL
CHOLESTEROL TOTAL: 133 mg/dL (ref 100–199)
Chol/HDL Ratio: 2.8 ratio units (ref 0.0–4.4)
HDL: 48 mg/dL (ref >39)
LDL CALC: 69 mg/dL (ref 0–99)
TRIGLYCERIDES: 79 mg/dL (ref 0–149)
VLDL Cholesterol Cal: 16 mg/dL (ref 5–40)

## 2015-04-12 LAB — COMPREHENSIVE METABOLIC PANEL
A/G RATIO: 2.1 (ref 1.1–2.5)
ALT: 25 IU/L (ref 0–32)
AST: 21 IU/L (ref 0–40)
Albumin: 4.4 g/dL (ref 3.6–4.8)
Alkaline Phosphatase: 70 IU/L (ref 39–117)
BILIRUBIN TOTAL: 0.3 mg/dL (ref 0.0–1.2)
BUN/Creatinine Ratio: 14 (ref 11–26)
BUN: 13 mg/dL (ref 8–27)
CALCIUM: 9.8 mg/dL (ref 8.7–10.3)
CHLORIDE: 102 mmol/L (ref 97–106)
CO2: 24 mmol/L (ref 18–29)
Creatinine, Ser: 0.95 mg/dL (ref 0.57–1.00)
GFR, EST AFRICAN AMERICAN: 74 mL/min/{1.73_m2} (ref >59)
GFR, EST NON AFRICAN AMERICAN: 64 mL/min/{1.73_m2} (ref >59)
GLOBULIN, TOTAL: 2.1 g/dL (ref 1.5–4.5)
Glucose: 103 mg/dL — ABNORMAL HIGH (ref 65–99)
POTASSIUM: 4.5 mmol/L (ref 3.5–5.2)
SODIUM: 145 mmol/L — AB (ref 136–144)
TOTAL PROTEIN: 6.5 g/dL (ref 6.0–8.5)

## 2015-05-03 ENCOUNTER — Telehealth: Payer: Self-pay | Admitting: Family Medicine

## 2015-05-03 DIAGNOSIS — I10 Essential (primary) hypertension: Secondary | ICD-10-CM

## 2015-05-03 NOTE — Telephone Encounter (Signed)
PT NEEDS REFILL ON BLOOD PRESSURE MEDS. HAD TO RESCH APPT SINCE DR IS OUT. PT SAID TO REMEMBER THAT DR UP HER DOSAGE TO 40 MG AFTER SHE CAME OUT OF THE [facility].  [facility] [place] [facility] [place]

## 2015-05-04 MED ORDER — LISINOPRIL 40 MG PO TABS
40.0000 mg | ORAL_TABLET | Freq: Every day | ORAL | Status: DC
Start: 2015-05-04 — End: 2015-08-18

## 2015-05-04 NOTE — Telephone Encounter (Signed)
Medication has been refilled and sent to [facility] [place]  

## 2015-05-12 ENCOUNTER — Ambulatory Visit: Payer: PRIVATE HEALTH INSURANCE | Admitting: Family Medicine

## 2015-05-18 ENCOUNTER — Ambulatory Visit: Payer: PRIVATE HEALTH INSURANCE | Admitting: Family Medicine

## 2015-06-07 ENCOUNTER — Encounter: Payer: Self-pay | Admitting: Family Medicine

## 2015-06-07 ENCOUNTER — Ambulatory Visit (INDEPENDENT_AMBULATORY_CARE_PROVIDER_SITE_OTHER): Payer: Managed Care, Other (non HMO) | Admitting: Family Medicine

## 2015-06-07 VITALS — BP 138/80 | HR 66 | Temp 98.7°F | Resp 17 | Ht 64.0 in | Wt 200.5 lb

## 2015-06-07 DIAGNOSIS — I499 Cardiac arrhythmia, unspecified: Secondary | ICD-10-CM

## 2015-06-07 DIAGNOSIS — I10 Essential (primary) hypertension: Secondary | ICD-10-CM | POA: Diagnosis not present

## 2015-06-07 DIAGNOSIS — E785 Hyperlipidemia, unspecified: Secondary | ICD-10-CM | POA: Diagnosis not present

## 2015-06-07 DIAGNOSIS — E119 Type 2 diabetes mellitus without complications: Secondary | ICD-10-CM

## 2015-06-07 NOTE — Progress Notes (Signed)
Name: Audrey Murray   MRN: [id number]    DOB: 1950-12-11   Date:06/07/2015       Progress Note  Subjective  Chief Complaint  Chief Complaint  Patient presents with  . Follow-up    1 mo  . Diabetes  . Hyperlipidemia  . Hypertension    Hyperlipidemia The problem is controlled. Recent lipid tests were reviewed and are normal. Exacerbating diseases include diabetes and obesity. Pertinent negatives include no chest pain, leg pain, myalgias or shortness of breath. Current antihyperlipidemic treatment includes statins and diet change. Risk factors for coronary artery disease include diabetes mellitus.  Hypertension This is a chronic problem. The problem is unchanged. The problem is controlled. Pertinent negatives include no blurred vision, chest pain, malaise/fatigue, palpitations or shortness of breath. Past treatments include ACE inhibitors and beta blockers. Hypertensive end-organ damage includes CAD/MI. There is no history of kidney disease or CVA.  Diabetes She presents for her follow-up diabetic visit. She has type 2 diabetes mellitus. Her disease course has been improving. Pertinent negatives for diabetes include no blurred vision, no chest pain and no weight loss. Pertinent negatives for diabetic complications include no CVA. Current diabetic treatment includes oral agent (monotherapy) (Being seen by [facility], discontinued insulin because of excellent BG control.). Her weight is stable. She is following a diabetic diet. She participates in exercise three times a week. Her breakfast blood glucose range is generally 90-110 mg/dl. An ACE inhibitor/angiotensin II receptor blocker is being taken.     Past Medical History  Diagnosis Date  . Hypertension   . Diabetes mellitus without complication ([place])   . Hyperlipidemia   . Heart attack [facility])     Past Surgical History  Procedure Laterality Date  . Cardiac catheterization N/A 03/25/2015    Procedure: Left Heart Cath and Coronary  Angiography;  Surgeon: Teodoro Spray, MD;  Location: [place] [facility];  Service: Cardiovascular;  Laterality: N/A;    Family History  Problem Relation Age of Onset  . Stroke Mother   . Diabetes Mother   . Stroke Father   . Hypertension Son   . Asthma Son     Social History   Social History  . Marital Status: Widowed    Spouse Name: N/A  . Number of Children: N/A  . Years of Education: N/A   Occupational History  . Not on file.   Social History Main Topics  . Smoking status: Never Smoker   . Smokeless tobacco: Not on file  . Alcohol Use: No  . Drug Use: No  . Sexual Activity: No   Other Topics Concern  . Not on file   Social History Narrative     Current outpatient prescriptions:  .  ACCU-CHEK AVIVA PLUS test strip, USE TID AS INSTRUCTED, Disp: , Rfl: 12 .  aspirin EC 81 MG EC tablet, Take 1 tablet (81 mg total) by mouth daily., Disp: 30 tablet, Rfl: 0 .  blood glucose meter kit and supplies KIT, Dispense based on patient and insurance preference. Use up to four times daily as directed. (FOR ICD-9 250.00, 250.01)., Disp: 1 each, Rfl: 0 .  lisinopril (PRINIVIL,ZESTRIL) 40 MG tablet, Take 1 tablet (40 mg total) by mouth daily., Disp: 90 tablet, Rfl: 0 .  metFORMIN (GLUCOPHAGE) 500 MG tablet, Take 1 tablet (500 mg total) by mouth 2 (two) times daily with a meal., Disp: 60 tablet, Rfl: 0 .  metoprolol tartrate (LOPRESSOR) 25 MG tablet, Take 1 tablet (25 mg total) by mouth  2 (two) times daily., Disp: 60 tablet, Rfl: 0 .  pravastatin (PRAVACHOL) 40 MG tablet, Take 1 tablet (40 mg total) by mouth daily at 6 PM., Disp: 30 tablet, Rfl: 0  No Known Allergies   Review of Systems  Constitutional: Negative for fever, chills, weight loss and malaise/fatigue.  Eyes: Negative for blurred vision and double vision.  Respiratory: Negative for shortness of breath.   Cardiovascular: Negative for chest pain and palpitations.  Gastrointestinal: Negative for nausea, vomiting,  abdominal pain and diarrhea.  Musculoskeletal: Negative for myalgias.     Objective  Filed Vitals:   06/07/15 0906  BP: 138/80  Pulse: 66  Temp: 98.7 F (37.1 C)  TempSrc: Oral  Resp: 17  Height: 5' 4"  (1.626 m)  Weight: 200 lb 8 oz (90.946 kg)  SpO2: 98%    Physical Exam  Constitutional: She is oriented to person, place, and time and well-developed, well-nourished, and in no distress.  HENT:  Head: Normocephalic and atraumatic.  Cardiovascular: Normal rate.  A regularly irregular rhythm present.  No murmur heard. Pulmonary/Chest: Effort normal and breath sounds normal.  Neurological: She is alert and oriented to person, place, and time.  Nursing note and vitals reviewed.    Assessment & Plan  1. Essential hypertension Blood pressure controlled on present therapy.  2. Type 2 diabetes mellitus without complication, without long-term current use of insulin ([place]) Being followed by endocrinology, has since discontinued insulin. Blood glucose logs reviewed. - Urine Microalbumin w/creat. ratio  3. Hyperlipidemia LDL goal <70 Normal lipid panel, reviewed.  4. Irregular cardiac rhythm Twelve-lead EKG shows sinus rhythm with premature atrial contractions. Patient asymptomatic, denies any chest pain, palpitations, lightheadedness, or other concerning symptoms. Discussed with Dr. Candis Musa, who recommended a follow-up with his office in one week. - EKG 12-Lead    Owais Pruett [facility] 06/07/2015 10:02 AM

## 2015-06-08 LAB — MICROALBUMIN / CREATININE URINE RATIO
CREATININE, UR: 21.8 mg/dL
MICROALB/CREAT RATIO: 208.3 mg/g{creat} — AB (ref 0.0–30.0)
Microalbumin, Urine: 45.4 ug/mL

## 2015-06-13 ENCOUNTER — Encounter: Payer: Self-pay | Admitting: Cardiovascular Disease

## 2015-06-13 ENCOUNTER — Ambulatory Visit (INDEPENDENT_AMBULATORY_CARE_PROVIDER_SITE_OTHER): Payer: Managed Care, Other (non HMO) | Admitting: Cardiovascular Disease

## 2015-06-13 VITALS — BP 188/110 | HR 58 | Ht 64.0 in | Wt 199.8 lb

## 2015-06-13 DIAGNOSIS — I214 Non-ST elevation (NSTEMI) myocardial infarction: Secondary | ICD-10-CM

## 2015-06-13 DIAGNOSIS — I10 Essential (primary) hypertension: Secondary | ICD-10-CM | POA: Diagnosis not present

## 2015-06-13 DIAGNOSIS — E119 Type 2 diabetes mellitus without complications: Secondary | ICD-10-CM

## 2015-06-13 DIAGNOSIS — R7989 Other specified abnormal findings of blood chemistry: Secondary | ICD-10-CM | POA: Diagnosis not present

## 2015-06-13 DIAGNOSIS — Z794 Long term (current) use of insulin: Secondary | ICD-10-CM

## 2015-06-13 DIAGNOSIS — R778 Other specified abnormalities of plasma proteins: Secondary | ICD-10-CM | POA: Insufficient documentation

## 2015-06-13 DIAGNOSIS — E785 Hyperlipidemia, unspecified: Secondary | ICD-10-CM

## 2015-06-13 DIAGNOSIS — I491 Atrial premature depolarization: Secondary | ICD-10-CM | POA: Insufficient documentation

## 2015-06-13 MED ORDER — AMLODIPINE BESYLATE 10 MG PO TABS: 10.0000 mg | ORAL_TABLET | Freq: Every day | ORAL | Status: DC

## 2015-06-13 NOTE — Progress Notes (Signed)
Patient ID: Audrey Murray, female    DOB: [birth year], 65 y.o.   MRN: [id number]  HPI Comments: Audrey Murray is a pleasant 65 year old woman with history of diabetes, obesity, hypertension, presenting by referral from Dr. Manuella Ghazi for consultation of recent troponin elevation in the hospital in November 2016, severe hypertension, palpitations. Recently admitted to the hospital November 2016 with glucose levels in the 500s, troponin 0.86, cardiac catheterization showing no significant coronary artery disease (she was labeled as having a non-ST elevation MI, in hindsight, likely demand ischemia from her elevated sugars) Hemoglobin A1c was 15 at the time Hospital records were reviewed in detail with the patient  EKG when she was seen with Dr. Brigitte Pulse showed APCs. She is asymptomatic In follow today, she is working with endocrinology for better diabetes control Currently she feels well, no complaints She's been taking her blood pressure measurements at home and they are typically in the 170 range She has been frustrated, gave up, now no longer checking Blood pressure is very elevated on today's visit, 852 systolic over 778  Glucose measurements at home 90-120 Was on insulin, now just taking metformin, trying to watch her diet. She has been losing weight  Lab work reviewed with her showing total cholesterol 133, LDL 69  Echocardiogram reviewed with her from the hospital showing normal ejection fraction greater than 55%  EKG on today's visit shows normal sinus rhythm with rate 58 bpm, no significant ST or T-wave changes     No Known Allergies  Current Outpatient Prescriptions on File Prior to Visit  Medication Sig Dispense Refill  . ACCU-CHEK AVIVA PLUS test strip USE TID AS INSTRUCTED  12  . aspirin EC 81 MG EC tablet Take 1 tablet (81 mg total) by mouth daily. 30 tablet 0  . blood glucose meter kit and supplies KIT Dispense based on patient and insurance preference. Use up to four times daily as  directed. (FOR ICD-9 250.00, 250.01). 1 each 0  . lisinopril (PRINIVIL,ZESTRIL) 40 MG tablet Take 1 tablet (40 mg total) by mouth daily. 90 tablet 0  . metFORMIN (GLUCOPHAGE) 500 MG tablet Take 1 tablet (500 mg total) by mouth 2 (two) times daily with a meal. 60 tablet 0  . metoprolol tartrate (LOPRESSOR) 25 MG tablet Take 1 tablet (25 mg total) by mouth 2 (two) times daily. 60 tablet 0  . pravastatin (PRAVACHOL) 40 MG tablet Take 1 tablet (40 mg total) by mouth daily at 6 PM. 30 tablet 0   No current facility-administered medications on file prior to visit.    Past Medical History  Diagnosis Date  . Hypertension   . Diabetes mellitus without complication ([place])   . Hyperlipidemia   . Heart attack [facility])     Past Surgical History  Procedure Laterality Date  . Cardiac catheterization N/A 03/25/2015    Procedure: Left Heart Cath and Coronary Angiography;  Surgeon: Teodoro Spray, MD;  Location: [place] [facility];  Service: Cardiovascular;  Laterality: N/A;    Social History  reports that she has never smoked. She does not have any smokeless tobacco history on file. She reports that she does not drink alcohol or use illicit drugs.  Family History family history includes Asthma in her son; Diabetes in her mother; Hypertension in her son; Stroke in her father and mother.    Review of Systems  Constitutional: Negative.   Respiratory: Negative.   Cardiovascular: Negative.   Gastrointestinal: Negative.   Musculoskeletal: Negative.   Neurological: Negative.  Hematological: Negative.   Psychiatric/Behavioral: Negative.   All other systems reviewed and are negative.   BP 188/110 mmHg  Pulse 58  Ht 5' 4" (1.626 m)  Wt 199 lb 12 oz (90.606 kg)  BMI 34.27 kg/m2   Physical Exam  Constitutional: She is oriented to person, place, and time. She appears well-developed and well-nourished.  HENT:  Head: Normocephalic.  Nose: Nose normal.  Mouth/Throat: Oropharynx is clear and  moist.  Eyes: Conjunctivae are normal. Pupils are equal, round, and reactive to light.  Neck: Normal range of motion. Neck supple. No JVD present.  Cardiovascular: Normal rate, regular rhythm, normal heart sounds and intact distal pulses.  Exam reveals no gallop and no friction rub.   No murmur heard. Pulmonary/Chest: Effort normal and breath sounds normal. No respiratory distress. She has no wheezes. She has no rales. She exhibits no tenderness.  Abdominal: Soft. Bowel sounds are normal. She exhibits no distension. There is no tenderness.  Musculoskeletal: Normal range of motion. She exhibits no edema or tenderness.  Lymphadenopathy:    She has no cervical adenopathy.  Neurological: She is alert and oriented to person, place, and time. Coordination normal.  Skin: Skin is warm and dry. No rash noted. No erythema.  Psychiatric: She has a normal mood and affect. Her behavior is normal. Judgment and thought content normal.

## 2015-06-13 NOTE — Assessment & Plan Note (Addendum)
Blood pressure is very elevated on today's visit We will add amlodipine 5 mg for 1 week increasing up to 10 mg Encouraged her to continue to monitor her blood pressure If she has side effects such as leg swelling, may need to change to alternate medication Other options include HCTZ, clonidine, isosorbide, Cardura

## 2015-06-13 NOTE — Assessment & Plan Note (Signed)
Cholesterol is at goal on the current lipid regimen. No changes to the medications were made.  

## 2015-06-13 NOTE — Patient Instructions (Signed)
You are doing well.  Please start amlodipine 1/2 pill in the morning for the first week Then up to a full pill every morning  Call the office if you get significant leg swelling  Please call us if you have new issues that need to be addressed before your next appt.  Your physician wants you to follow-up in: 1 month.

## 2015-06-13 NOTE — Assessment & Plan Note (Signed)
PACs seen on previous EKG. Not seen on today's EKG. No medication changes needed, she was asymptomatic, we'll continue low-dose beta blocker

## 2015-06-13 NOTE — Assessment & Plan Note (Signed)
We have encouraged continued exercise, careful diet management in an effort to lose weight. 

## 2015-06-13 NOTE — Assessment & Plan Note (Addendum)
Cardiac catheterization showing no significant coronary artery disease She did not have a non-ST elevation MI as detailed in the discharge notes, She had troponin elevation in the setting of poorly controlled diabetes, glucose more than 500 No further cardiac workup needed at this time   Total encounter time more than 60 minutes  Greater than 50% was spent in counseling and coordination of care with the patient

## 2015-07-26 ENCOUNTER — Ambulatory Visit
Admission: RE | Admit: 2015-07-26 | Discharge: 2015-07-26 | Disposition: A | Discharge status: Home / Self Care | Payer: Managed Care, Other (non HMO) | Source: Ambulatory Visit | Attending: Physician Assistant | Admitting: Physician Assistant

## 2015-07-26 ENCOUNTER — Encounter: Payer: Self-pay | Admitting: Physician Assistant

## 2015-07-26 ENCOUNTER — Ambulatory Visit: Payer: Self-pay | Admitting: Physician Assistant

## 2015-07-26 VITALS — BP 150/90 | HR 78 | Temp 98.5°F

## 2015-07-26 DIAGNOSIS — M25532 Pain in left wrist: Secondary | ICD-10-CM

## 2015-07-26 DIAGNOSIS — S6992XA Unspecified injury of left wrist, hand and finger(s), initial encounter: Secondary | ICD-10-CM | POA: Diagnosis present

## 2015-07-26 DIAGNOSIS — T148XXA Other injury of unspecified body region, initial encounter: Secondary | ICD-10-CM

## 2015-07-26 DIAGNOSIS — W19XXXA Unspecified fall, initial encounter: Secondary | ICD-10-CM | POA: Insufficient documentation

## 2015-07-26 DIAGNOSIS — S52502A Unspecified fracture of the lower end of left radius, initial encounter for closed fracture: Secondary | ICD-10-CM | POA: Diagnosis not present

## 2015-07-26 MED ORDER — TETANUS-DIPHTH-ACELL PERTUSSIS 5-2.5-18.5 LF-MCG/0.5 IM SUSP
0.5000 mL | Freq: Once | INTRAMUSCULAR | Status: AC
  Administered 2015-07-26: 0.5 mL via INTRAMUSCULAR

## 2015-07-26 NOTE — Progress Notes (Signed)
S: yesterday fell on outstretched hand while walking downtown, wrist is painful and swollen, r knee has abrasion, no leg pain, ? If tdap utd  O: vitals wnl, nad, left wrist tender, swollen, warm to touch, has good rom, elbow, shoulder, and fingers nontender, r knee with abrasion, no bony tenderness, n/v intact  A: acute wrist pain, abrasion  P: tdap given in clinic, wrist splint applied, ice, xray left wrist

## 2015-07-27 NOTE — Progress Notes (Signed)
I spoke to the patient about her xray report and she expressed understanding.  Patient was referred to see Dedra Skeensodd Mundy[facility] PA[facility]C [facility].  Patient has been notified of appointment.

## 2015-07-29 ENCOUNTER — Ambulatory Visit (INDEPENDENT_AMBULATORY_CARE_PROVIDER_SITE_OTHER): Payer: Managed Care, Other (non HMO) | Admitting: Cardiovascular Disease

## 2015-07-29 ENCOUNTER — Ambulatory Visit: Payer: Managed Care, Other (non HMO) | Admitting: Cardiovascular Disease

## 2015-07-29 ENCOUNTER — Encounter: Payer: Self-pay | Admitting: Cardiovascular Disease

## 2015-07-29 VITALS — BP 140/84 | HR 59 | Ht 64.0 in | Wt 189.0 lb

## 2015-07-29 DIAGNOSIS — Z794 Long term (current) use of insulin: Secondary | ICD-10-CM

## 2015-07-29 DIAGNOSIS — E785 Hyperlipidemia, unspecified: Secondary | ICD-10-CM

## 2015-07-29 DIAGNOSIS — I10 Essential (primary) hypertension: Secondary | ICD-10-CM | POA: Diagnosis not present

## 2015-07-29 DIAGNOSIS — E119 Type 2 diabetes mellitus without complications: Secondary | ICD-10-CM

## 2015-07-29 NOTE — Assessment & Plan Note (Signed)
Blood pressure is well controlled on today's visit. No changes made to the medications. If blood pressure does run high, suggested she could take extra low-dose amlodipine before bed

## 2015-07-29 NOTE — Assessment & Plan Note (Signed)
We have encouraged continued exercise, careful diet management in an effort to lose weight. Down 10 pounds from her prior clinic visit

## 2015-07-29 NOTE — Patient Instructions (Signed)
You are doing well. No medication changes were made.  Please call us if you have new issues that need to be addressed before your next appt.  Your physician wants you to follow-up in: 6 months.  You will receive a reminder letter in the mail two months in advance. If you don't receive a letter, please call our office to schedule the follow-up appointment.   

## 2015-07-29 NOTE — Assessment & Plan Note (Signed)
Cholesterol is at goal on the current lipid regimen. No changes to the medications were made. Numbers should improve with weight loss and improve diabetes

## 2015-07-29 NOTE — Progress Notes (Signed)
Patient ID: Audrey Murray, female    DOB: Oct 10, 1950, 66 y.o.   MRN: [id number]  HPI Comments: Audrey Murray is a pleasant 65 year old woman with history of diabetes, obesity, hypertension, patient of Dr. Dr. Manuella Ghazi,  history of troponin elevation in the hospital in November 2016, severe hypertension, palpitations. Previously admitted to the hospital November 2016 with glucose levels in the 500s, troponin 0.86, cardiac catheterization showing no significant coronary artery disease (she was labeled as having a non-ST elevation MI, in hindsight, likely demand ischemia from her elevated sugars) Hemoglobin A1c was 15 at the time She presents today for follow-up of her hypertension  In follow-up today, she reports that she is doing well Blood pressure has improved dramatically on her current medication regimen. She was unable to tolerate amlodipine 10 mg daily, only takes 5 mg. Weight has dramatically improved, down 10 pounds from 6 weeks ago. Blood pressure cuff brought with her today with systolic pressures ranging from 111 up to the 140s, frequently in the 130 range. Diastolic in the 71-69 range She is walking more for exercise and weight loss, has radically changed her diet  Recent fall, radial fracture of the left arm, scheduled to see orthopedics Currently in a brace  Other past medical history Lab work reviewed with her showing total cholesterol 133, LDL 69  Echocardiogram reviewed with her from the hospital showing normal ejection fraction greater than 55%  Previous EKG on today's visit shows normal sinus rhythm with rate 58 bpm, no significant ST or T-wave changes     No Known Allergies  Current Outpatient Prescriptions on File Prior to Visit  Medication Sig Dispense Refill  . ACCU-CHEK AVIVA PLUS test strip USE TID AS INSTRUCTED  12  . amLODipine (NORVASC) 10 MG tablet Take 1 tablet (10 mg total) by mouth daily. (Patient taking differently: Take 5 mg by mouth daily. ) 90 tablet 3   . aspirin EC 81 MG EC tablet Take 1 tablet (81 mg total) by mouth daily. 30 tablet 0  . blood glucose meter kit and supplies KIT Dispense based on patient and insurance preference. Use up to four times daily as directed. (FOR ICD-9 250.00, 250.01). 1 each 0  . lisinopril (PRINIVIL,ZESTRIL) 40 MG tablet Take 1 tablet (40 mg total) by mouth daily. 90 tablet 0  . metFORMIN (GLUCOPHAGE) 500 MG tablet Take 1 tablet (500 mg total) by mouth 2 (two) times daily with a meal. 60 tablet 0  . metoprolol tartrate (LOPRESSOR) 25 MG tablet Take 1 tablet (25 mg total) by mouth 2 (two) times daily. 60 tablet 0  . pravastatin (PRAVACHOL) 40 MG tablet Take 1 tablet (40 mg total) by mouth daily at 6 PM. 30 tablet 0   No current facility-administered medications on file prior to visit.    Past Medical History  Diagnosis Date  . Hypertension   . Diabetes mellitus without complication ([place])   . Hyperlipidemia   . Heart attack [facility])     Past Surgical History  Procedure Laterality Date  . Cardiac catheterization N/A 03/25/2015    Procedure: Left Heart Cath and Coronary Angiography;  Surgeon: Teodoro Spray, MD;  Location: [place] [facility];  Service: Cardiovascular;  Laterality: N/A;    Social History  reports that she has never smoked. She does not have any smokeless tobacco history on file. She reports that she does not drink alcohol or use illicit drugs.  Family History family history includes Asthma in her son; Diabetes in her  mother; Hypertension in her son; Stroke in her father and mother.  Review of Systems  Constitutional: Negative.   Respiratory: Negative.   Cardiovascular: Negative.   Gastrointestinal: Negative.   Musculoskeletal: Negative.   Neurological: Negative.   Hematological: Negative.   Psychiatric/Behavioral: Negative.   All other systems reviewed and are negative.   BP 140/84 mmHg  Pulse 59  Ht _0  (1.626 m)  Wt 189 lb (85.73 kg)  BMI 32.43 kg/m2   Physical Exam   Constitutional: She is oriented to person, place, and time. She appears well-developed and well-nourished.  HENT:  Head: Normocephalic.  Nose: Nose normal.  Mouth/Throat: Oropharynx is clear and moist.  Eyes: Conjunctivae are normal. Pupils are equal, round, and reactive to light.  Neck: Normal range of motion. Neck supple. No JVD present.  Cardiovascular: Normal rate, regular rhythm, normal heart sounds and intact distal pulses.  Exam reveals no gallop and no friction rub.   No murmur heard. Pulmonary/Chest: Effort normal and breath sounds normal. No respiratory distress. She has no wheezes. She has no rales. She exhibits no tenderness.  Abdominal: Soft. Bowel sounds are normal. She exhibits no distension. There is no tenderness.  Musculoskeletal: Normal range of motion. She exhibits no edema or tenderness.  Lymphadenopathy:    She has no cervical adenopathy.  Neurological: She is alert and oriented to person, place, and time. Coordination normal.  Skin: Skin is warm and dry. No rash noted. No erythema.  Psychiatric: She has a normal mood and affect. Her behavior is normal. Judgment and thought content normal.

## 2015-08-18 ENCOUNTER — Other Ambulatory Visit: Payer: Self-pay | Admitting: Family Medicine

## 2015-10-07 ENCOUNTER — Other Ambulatory Visit: Payer: Self-pay | Admitting: Family Medicine

## 2015-11-04 ENCOUNTER — Ambulatory Visit: Payer: Managed Care, Other (non HMO) | Admitting: Family Medicine

## 2015-11-07 ENCOUNTER — Other Ambulatory Visit: Payer: Self-pay | Admitting: Family Medicine

## 2015-12-02 ENCOUNTER — Encounter: Payer: Self-pay | Admitting: Family Medicine

## 2015-12-02 ENCOUNTER — Ambulatory Visit (INDEPENDENT_AMBULATORY_CARE_PROVIDER_SITE_OTHER): Payer: Managed Care, Other (non HMO) | Admitting: Family Medicine

## 2015-12-02 VITALS — BP 130/80 | HR 71 | Temp 98.7°F | Resp 16 | Ht 64.0 in | Wt 179.3 lb

## 2015-12-02 DIAGNOSIS — I10 Essential (primary) hypertension: Secondary | ICD-10-CM | POA: Diagnosis not present

## 2015-12-02 DIAGNOSIS — E785 Hyperlipidemia, unspecified: Secondary | ICD-10-CM

## 2015-12-02 DIAGNOSIS — E1121 Type 2 diabetes mellitus with diabetic nephropathy: Secondary | ICD-10-CM

## 2015-12-02 LAB — COMPREHENSIVE METABOLIC PANEL
ALK PHOS: 51 U/L (ref 33–130)
ALT: 21 U/L (ref 6–29)
AST: 17 U/L (ref 10–35)
Albumin: 4.2 g/dL (ref 3.6–5.1)
BUN: 19 mg/dL (ref 7–25)
CO2: 26 mmol/L (ref 20–31)
CREATININE: 0.78 mg/dL (ref 0.50–0.99)
Calcium: 9.5 mg/dL (ref 8.6–10.4)
Chloride: 105 mmol/L (ref 98–110)
Glucose, Bld: 96 mg/dL (ref 65–99)
POTASSIUM: 3.8 mmol/L (ref 3.5–5.3)
Sodium: 142 mmol/L (ref 135–146)
TOTAL PROTEIN: 6.7 g/dL (ref 6.1–8.1)
Total Bilirubin: 0.5 mg/dL (ref 0.2–1.2)

## 2015-12-02 LAB — LIPID PANEL
Cholesterol: 112 mg/dL — ABNORMAL LOW (ref 125–200)
HDL: 53 mg/dL (ref >=46)
LDL CALC: 46 mg/dL (ref <130)
TRIGLYCERIDES: 66 mg/dL (ref <150)
Total CHOL/HDL Ratio: 2.1 Ratio (ref <=5.0)
VLDL: 13 mg/dL (ref <30)

## 2015-12-02 LAB — GLUCOSE, POCT (MANUAL RESULT ENTRY): POC Glucose: 96 mg/dl (ref 70–99)

## 2015-12-02 LAB — POCT GLYCOSYLATED HEMOGLOBIN (HGB A1C): Hemoglobin A1C: 6

## 2015-12-02 MED ORDER — LISINOPRIL 40 MG PO TABS: 40.0000 mg | ORAL_TABLET | Freq: Every day | ORAL | 0 refills | Status: AC

## 2015-12-02 MED ORDER — METOPROLOL TARTRATE 25 MG PO TABS: 25.0000 mg | ORAL_TABLET | Freq: Two times a day (BID) | ORAL | 0 refills | Status: AC

## 2015-12-02 MED ORDER — METFORMIN HCL 500 MG PO TABS: 500.0000 mg | ORAL_TABLET | Freq: Two times a day (BID) | ORAL | 0 refills | Status: AC

## 2015-12-02 MED ORDER — PRAVASTATIN SODIUM 40 MG PO TABS: 40.0000 mg | ORAL_TABLET | Freq: Every day | ORAL | 0 refills | Status: AC

## 2015-12-02 NOTE — Progress Notes (Signed)
Name: Audrey Murray   MRN: [id number]    DOB: 1951-04-17   Date:12/02/2015       Progress Note  Subjective  Chief Complaint  Chief Complaint  Patient presents with  . Diabetes    follow up  . Hypertension  . Hyperlipidemia    Diabetes  She presents for her follow-up diabetic visit. She has type 2 diabetes mellitus. Her disease course has been improving. Pertinent negatives for hypoglycemia include no headaches. Pertinent negatives for diabetes include no blurred vision, no chest pain, no fatigue, no polydipsia and no polyuria. She is following a diabetic diet. Her breakfast blood glucose range is generally 110-130 mg/dl. An ACE inhibitor/angiotensin II receptor blocker is being taken.  Hypertension  This is a chronic problem. The problem is unchanged. Pertinent negatives include no blurred vision, chest pain, headaches, palpitations or shortness of breath. Past treatments include ACE inhibitors.  Hyperlipidemia  This is a chronic problem. The problem is controlled. Recent lipid tests were reviewed and are normal. Pertinent negatives include no chest pain, leg pain, myalgias or shortness of breath. Current antihyperlipidemic treatment includes statins.    Past Medical History:  Diagnosis Date  . Diabetes mellitus without complication ([place])   . Heart attack ([place])   . Hyperlipidemia   . Hypertension     Past Surgical History:  Procedure Laterality Date  . CARDIAC CATHETERIZATION N/A 03/25/2015   Procedure: Left Heart Cath and Coronary Angiography;  Surgeon: Teodoro Spray, MD;  Location: [facility];  Service: Cardiovascular;  Laterality: N/A;    Family History  Problem Relation Age of Onset  . Stroke Mother   . Diabetes Mother   . Stroke Father   . Hypertension Son   . Asthma Son     Social History   Social History  . Marital status: Widowed    Spouse name: N/A  . Number of children: N/A  . Years of education: N/A   Occupational History  . Not on file.    Social History Main Topics  . Smoking status: Never Smoker  . Smokeless tobacco: Never Used  . Alcohol use No  . Drug use: No  . Sexual activity: No   Other Topics Concern  . Not on file   Social History Narrative  . No narrative on file     Current Outpatient Prescriptions:  .  ACCU-CHEK AVIVA PLUS test strip, USE TID AS INSTRUCTED, Disp: , Rfl: 12 .  amLODipine (NORVASC) 10 MG tablet, Take 1 tablet (10 mg total) by mouth daily. (Patient taking differently: Take 5 mg by mouth daily. ), Disp: 90 tablet, Rfl: 3 .  aspirin EC 81 MG EC tablet, Take 1 tablet (81 mg total) by mouth daily., Disp: 30 tablet, Rfl: 0 .  blood glucose meter kit and supplies KIT, Dispense based on patient and insurance preference. Use up to four times daily as directed. (FOR ICD-9 250.00, 250.01)., Disp: 1 each, Rfl: 0 .  lisinopril (PRINIVIL,ZESTRIL) 40 MG tablet, TAKE 1 TABLET(40 MG) BY MOUTH DAILY, Disp: 90 tablet, Rfl: 0 .  metFORMIN (GLUCOPHAGE) 500 MG tablet, Take 1 tablet (500 mg total) by mouth 2 (two) times daily with a meal., Disp: 60 tablet, Rfl: 0 .  metoprolol tartrate (LOPRESSOR) 25 MG tablet, TAKE 1 TABLET(25 MG) BY MOUTH TWICE DAILY, Disp: 60 tablet, Rfl: 0 .  pravastatin (PRAVACHOL) 40 MG tablet, Take 1 tablet (40 mg total) by mouth daily at 6 PM., Disp: 30 tablet, Rfl: 0  No  Known Allergies   Review of Systems  Constitutional: Negative for fatigue.  Eyes: Negative for blurred vision.  Respiratory: Negative for shortness of breath.   Cardiovascular: Negative for chest pain and palpitations.  Musculoskeletal: Negative for myalgias.  Neurological: Negative for headaches.  Endo/Heme/Allergies: Negative for polydipsia.    Objective  Vitals:   12/02/15 1043  BP: 130/80  Pulse: 71  Resp: 16  Temp: 98.7 F (37.1 C)  TempSrc: Oral  SpO2: 96%  Weight: 179 lb 4.8 oz (81.3 kg)  Height: _0  (1.626 m)    Physical Exam  Constitutional: She is well-developed, well-nourished, and in  no distress.  HENT:  Head: Normocephalic and atraumatic.  Eyes: Pupils are equal, round, and reactive to light.  Cardiovascular: Normal rate and regular rhythm.   Murmur heard.  Systolic murmur is present with a grade of 3/6  Pulmonary/Chest: Effort normal and breath sounds normal. She has no wheezes.  Abdominal: Soft. Bowel sounds are normal. There is no tenderness.  Musculoskeletal:       Right shoulder: She exhibits no swelling.       Right ankle: She exhibits no swelling.       Left ankle: She exhibits no swelling.  Skin: Skin is warm and dry.  Psychiatric: Mood, memory, affect and judgment normal.  Nursing note and vitals reviewed.  Recent Results (from the past 2160 hour(s))  POCT HgB A1C     Status: None   Collection Time: 12/02/15 10:48 AM  Result Value Ref Range   Hemoglobin A1C 6.0   POCT Glucose (CBG)     Status: None   Collection Time: 12/02/15 10:49 AM  Result Value Ref Range   POC Glucose 96 70 - 99 mg/dl     Assessment & Plan  1. Type 2 diabetes mellitus with diabetic nephropathy, without long-term current use of insulin (HCC) Diabetes is well controlled, A1c is improved from almost 14% to below 7% in the last 8-9 months. Continue on metformin. Obtain urine microalbumin to rule out nephropathy. - POCT HgB A1C - POCT Glucose (CBG) - Urine Microalbumin w/creat. ratio - metFORMIN (GLUCOPHAGE) 500 MG tablet; Take 1 tablet (500 mg total) by mouth 2 (two) times daily with a meal.  Dispense: 180 tablet; Refill: 0  2. Hyperlipidemia LDL goal <70 LDL is at goal, continue on pravastatin - Lipid Profile - Comprehensive Metabolic Panel (CMET) - pravastatin (PRAVACHOL) 40 MG tablet; Take 1 tablet (40 mg total) by mouth daily at 6 PM.  Dispense: 90 tablet; Refill: 0  3. Essential hypertension  - metoprolol tartrate (LOPRESSOR) 25 MG tablet; Take 1 tablet (25 mg total) by mouth 2 (two) times daily.  Dispense: 180 tablet; Refill: 0 - lisinopril (PRINIVIL,ZESTRIL) 40 MG  tablet; Take 1 tablet (40 mg total) by mouth daily.  Dispense: 90 tablet; Refill: 0   Adel Neyer [facility] 12/02/2015 10:57 AM

## 2015-12-05 LAB — MICROALBUMIN / CREATININE URINE RATIO
CREATININE, URINE: 66 mg/dL (ref 20–320)
MICROALB UR: 2.5 mg/dL
MICROALB/CREAT RATIO: 38 ug/mg{creat} — AB (ref <30)

## 2016-03-05 ENCOUNTER — Ambulatory Visit: Payer: Managed Care, Other (non HMO) | Admitting: Family Medicine

## 2016-06-25 ENCOUNTER — Telehealth: Payer: Self-pay | Admitting: Cardiovascular Disease

## 2016-06-25 NOTE — Telephone Encounter (Signed)
3 attempts to schedule fu from recall list. lmov to call office .  Deleting recall .   °

## 2016-09-13 ENCOUNTER — Encounter: Payer: Managed Care, Other (non HMO) | Admitting: Family Medicine

## 2020-11-24 ENCOUNTER — Ambulatory Visit
Admission: EM | Admit: 2020-11-24 | Discharge: 2020-11-24 | Disposition: A | Discharge status: Home / Self Care | Payer: Self-pay | Attending: Family Medicine | Admitting: Family Medicine

## 2020-11-24 ENCOUNTER — Other Ambulatory Visit: Payer: Self-pay

## 2020-11-24 ENCOUNTER — Ambulatory Visit (INDEPENDENT_AMBULATORY_CARE_PROVIDER_SITE_OTHER): Payer: Self-pay

## 2020-11-24 DIAGNOSIS — M546 Pain in thoracic spine: Secondary | ICD-10-CM

## 2020-11-24 DIAGNOSIS — W19XXXA Unspecified fall, initial encounter: Secondary | ICD-10-CM

## 2020-11-24 DIAGNOSIS — M549 Dorsalgia, unspecified: Secondary | ICD-10-CM

## 2020-11-24 DIAGNOSIS — I160 Hypertensive urgency: Secondary | ICD-10-CM

## 2020-11-24 MED ORDER — AMLODIPINE BESYLATE 10 MG PO TABS: 10.0000 mg | ORAL_TABLET | Freq: Every day | ORAL | 3 refills | Status: AC

## 2020-11-24 MED ORDER — BACLOFEN 10 MG PO TABS: 5.0000 mg | ORAL_TABLET | Freq: Three times a day (TID) | ORAL | 0 refills | Status: AC | PRN

## 2020-11-24 NOTE — ED Triage Notes (Signed)
Pt states she was cleaning up around 4pm, lost balance fell on bottom as she was getting up fell on back. No LOC no other SX. Pain is more on sides.

## 2020-11-24 NOTE — Discharge Instructions (Addendum)
Rest.  Heat.  Medications as prescribed.  Please see a Primary care physician.  Take care  Dr. Adriana Simas

## 2020-11-24 NOTE — ED Provider Notes (Signed)
[facility]    CSN: [id number] Arrival date & time: 11/24/20  1754      History   Chief Complaint Chief Complaint  Patient presents with   Fall    HPI 70 year old female presents with the above complaint.  Patient states that she was cleaning up this afternoon.  She was reaching down and subsequently fell.  She states that she landed on her bottom and subsequently on her back.  She reports upper back pain bilaterally.  Pain is mild currently.  No significant lower back pain.  No radicular symptoms.  She has tried stretching without resolution.  She states that her family was concerned and advised her to come in for evaluation.  No radicular symptoms.  Of note, patient's blood pressure is markedly elevated.  She has not seen her physician in years and is not currently on any medication for her chronic medical issues.  Past Medical History:  Diagnosis Date   Diabetes mellitus without complication ([place])    Heart attack ([place])    Hyperlipidemia    Hypertension     Patient Active Problem List   Diagnosis Date Noted   Elevated troponin 06/13/2015   PAC (premature atrial contraction) 06/13/2015   Irregular cardiac rhythm 06/07/2015   Type 2 diabetes mellitus treated with insulin ([place]) 03/26/2015   Essential hypertension 03/26/2015   Hyperlipidemia LDL goal <70 03/26/2015    Past Surgical History:  Procedure Laterality Date   CARDIAC CATHETERIZATION N/A 03/25/2015   Procedure: Left Heart Cath and Coronary Angiography;  Surgeon: Teodoro Spray, MD;  Location: [facility];  Service: Cardiovascular;  Laterality: N/A;    OB History   No obstetric history on file.      Home Medications    Prior to Admission medications   Medication Sig Start Date End Date Taking? Authorizing Provider  baclofen (LIORESAL) 10 MG tablet Take 0.5-1 tablets (5-10 mg total) by mouth 3 (three) times daily as needed for muscle spasms. 11/24/20  Yes Makara Lanzo G, DO  ACCU-CHEK  AVIVA PLUS test strip USE TID AS INSTRUCTED 04/27/15   [provider]  amLODipine (NORVASC) 10 MG tablet Take 1 tablet (10 mg total) by mouth daily. 11/24/20   Coral Spikes, DO  aspirin EC 81 MG EC tablet Take 1 tablet (81 mg total) by mouth daily. 03/26/15   Aldean Jewett, MD  blood glucose meter kit and supplies KIT Dispense based on patient and insurance preference. Use up to four times daily as directed. (FOR ICD-9 250.00, 250.01). 03/26/15   Aldean Jewett, MD  lisinopril (PRINIVIL,ZESTRIL) 40 MG tablet Take 1 tablet (40 mg total) by mouth daily. 12/02/15   Roselee Nova, MD  metFORMIN (GLUCOPHAGE) 500 MG tablet Take 1 tablet (500 mg total) by mouth 2 (two) times daily with a meal. 12/02/15   Roselee Nova, MD  metoprolol tartrate (LOPRESSOR) 25 MG tablet Take 1 tablet (25 mg total) by mouth 2 (two) times daily. 12/02/15   Roselee Nova, MD  pravastatin (PRAVACHOL) 40 MG tablet Take 1 tablet (40 mg total) by mouth daily at 6 PM. 12/02/15   Roselee Nova, MD    Family History Family History  Problem Relation Age of Onset   Stroke Mother    Diabetes Mother    Stroke Father    Hypertension Son    Asthma Son     Social History Social History   Tobacco Use  Smoking status: Never   Smokeless tobacco: Never  Substance Use Topics   Alcohol use: No   Drug use: No     Allergies   Patient has no known allergies.   Review of Systems Review of Systems Per HPI  Physical Exam Triage Vital Signs ED Triage Vitals  Enc Vitals Group     BP 11/24/20 1812 (!) 232/120     Pulse Rate 11/24/20 1812 (!) 108     Resp 11/24/20 1812 16     Temp 11/24/20 1812 98.2 F (36.8 C)     Temp Source 11/24/20 1812 Oral     SpO2 11/24/20 1812 100 %     Weight 11/24/20 1808 221 lb (100.2 kg)     Height 11/24/20 1808 _0  (1.6 m)     Head Circumference --      Peak Flow --      Pain Score 11/24/20 1808 1     Pain Loc --      Pain Edu? --      Excl. in [place]? --     Updated Vital Signs BP (!) 232/120 (BP Location: Left Arm)   Pulse (!) 108   Temp 98.2 F (36.8 C) (Oral)   Resp 16   Ht _1  (1.6 m)   Wt 100.2 kg   SpO2 100%   BMI 39.15 kg/m   Visual Acuity Right Eye Distance:   Left Eye Distance:   Bilateral Distance:    Right Eye Near:   Left Eye Near:    Bilateral Near:     Physical Exam Vitals and nursing note reviewed.  Constitutional:      General: She is not in acute distress.    Appearance: Normal appearance. She is not ill-appearing.  HENT:     Head: Normocephalic and atraumatic.  Eyes:     General:        Right eye: No discharge.        Left eye: No discharge.     Conjunctiva/sclera: Conjunctivae normal.  Cardiovascular:     Rate and Rhythm: Regular rhythm. Tachycardia present.  Pulmonary:     Effort: Pulmonary effort is normal.     Breath sounds: Normal breath sounds. No wheezing, rhonchi or rales.  Musculoskeletal:     Comments: Mild tenderness over the paraspinal musculature of the thoracic spine.  No significant tenderness of the lumbar spine.  Neurological:     Mental Status: She is alert.  Psychiatric:        Mood and Affect: Mood normal.        Behavior: Behavior normal.     UC Treatments / Results  Labs (all labs ordered are listed, but only abnormal results are displayed) Labs Reviewed - No data to display  EKG   Radiology DG Thoracic Spine 2 View  Result Date: 11/24/2020 CLINICAL DATA:  Fall, back injury EXAM: THORACIC SPINE 2 VIEWS COMPARISON:  None. FINDINGS: No fracture or dislocation is seen. Mild degenerative changes of the mid thoracic spine. Visualized lungs are clear. IMPRESSION: Negative. Electronically Signed   By: Julian Hy M.D.   On: 11/24/2020 19:19   DG Lumbar Spine Complete  Result Date: 11/24/2020 CLINICAL DATA:  Fall, back injury EXAM: LUMBAR SPINE - COMPLETE 4+ VIEW COMPARISON:  None. FINDINGS: Normal lumbar lordosis. No evidence of fracture or dislocation. Vertebral  body heights are maintained. Mild degenerative changes of the lumbar spine. Visualized bony pelvis appears intact. IMPRESSION: Negative. Electronically Signed   By: Bertis Ruddy  [place] Pink M.D.   On: 11/24/2020 19:20    Procedures Procedures (including critical care time)  Medications Ordered in UC Medications - No data to display  Initial Impression / Assessment and Plan / UC Course  I have reviewed the triage vital signs and the nursing notes.  Pertinent labs & imaging results that were available during my care of the patient were reviewed by me and considered in my medical decision making (see chart for details).    70 year old female presents with back pain after suffering a fall.  Patient's blood pressure also markedly elevated.  Patient experiencing hypertensive urgency.  This is due to noncompliance.  Placing on amlodipine.  X-rays were obtained of the lumbar and thoracic spine and were negative.  Baclofen as needed.  Restarting amlodipine.  Advise follow-up with a primary care physician.  Final Clinical Impressions(s) / UC Diagnoses   Final diagnoses:  Acute bilateral thoracic back pain  Hypertensive urgency     Discharge Instructions      Rest.  Heat.  Medications as prescribed.  Please see a Primary care physician.  Take care  Dr. Lacinda Axon    ED Prescriptions     Medication Sig Dispense Auth. Provider   amLODipine (NORVASC) 10 MG tablet Take 1 tablet (10 mg total) by mouth daily. 90 tablet Seriyah Collison G, DO   baclofen (LIORESAL) 10 MG tablet Take 0.5-1 tablets (5-10 mg total) by mouth 3 (three) times daily as needed for muscle spasms. 82 each Coral Spikes, DO      PDMP not reviewed this encounter.   Coral Spikes, [place] 11/24/20 2030

## 2020-12-02 ENCOUNTER — Telehealth: Payer: Self-pay

## 2020-12-02 NOTE — Telephone Encounter (Signed)
-----   Message from Aaron Edelman, RN sent at 12/02/2020  2:40 PM EDT ----- Regarding: UC to PCP Patient needs to establish with PCP - routine

## 2020-12-02 NOTE — Telephone Encounter (Signed)
Patient is already established with a pcp

## 2022-06-14 ENCOUNTER — Encounter (INDEPENDENT_AMBULATORY_CARE_PROVIDER_SITE_OTHER): Payer: Self-pay | Admitting: Vascular Surgery

## 2022-06-21 ENCOUNTER — Encounter (INDEPENDENT_AMBULATORY_CARE_PROVIDER_SITE_OTHER): Payer: Self-pay | Admitting: Vascular Surgery

## 2022-09-02 DIAGNOSIS — I890 Lymphedema, not elsewhere classified: Secondary | ICD-10-CM | POA: Insufficient documentation

## 2022-09-02 NOTE — Progress Notes (Signed)
MRN : [id number]  Audrey Murray is a 72 y.o. (11/08/1950) female who presents with chief complaint of legs swell.  History of Present Illness:   Patient is seen for evaluation of leg swelling. The patient first noticed the swelling remotely but is now concerned because of a significant increase in the overall edema. The swelling isn't associated with significant pain.  There has been an increasing amount of  discoloration noted by the patient. The patient notes that in the morning the legs are improved but they steadily worsened throughout the course of the day. Elevation seems to make the swelling of the legs better, dependency makes them much worse.   There is no history of ulcerations associated with the swelling.   The patient denies any recent changes in their medications.  The patient has not been wearing graduated compression.  The patient has no had any past angiography, interventions or vascular surgery.  The patient denies a history of DVT or PE. There is no prior history of phlebitis. There is no history of primary lymphedema.  There is no history of radiation treatment to the groin or pelvis No history of malignancies. No history of trauma or groin or pelvic surgery. No history of foreign travel or parasitic infections area   No outpatient medications have been marked as taking for the 09/03/22 encounter (Appointment) with Gilda Crease, Latina Craver, MD.    Past Medical History:  Diagnosis Date   Diabetes mellitus without complication (HCC)    Heart attack (HCC)    Hyperlipidemia    Hypertension     Past Surgical History:  Procedure Laterality Date   CARDIAC CATHETERIZATION N/A 03/25/2015   Procedure: Left Heart Cath and Coronary Angiography;  Surgeon: Dalia Heading, MD;  Location: [facility];  Service: Cardiovascular;  Laterality: N/A;    Social History Social History   Tobacco Use   Smoking status: Never   Smokeless tobacco: Never  Substance  Use Topics   Alcohol use: No   Drug use: No    Family History Family History  Problem Relation Age of Onset   Stroke Mother    Diabetes Mother    Stroke Father    Hypertension Son    Asthma Son     No Known Allergies   REVIEW OF SYSTEMS (Negative unless checked)  Constitutional: [] Weight loss  [] Fever  [] Chills Cardiac: [] Chest pain   [] Chest pressure   [] Palpitations   [] Shortness of breath when laying flat   [] Shortness of breath with exertion. Vascular:  [] Pain in legs with walking   [x] Pain in legs with standing  [] History of DVT   [] Phlebitis   [x] Swelling in legs   [] Varicose veins   [] Non-healing ulcers Pulmonary:   [] Uses home oxygen   [] Productive cough   [] Hemoptysis   [] Wheeze  [] COPD   [] Asthma Neurologic:  [] Dizziness   [] Seizures   [] History of stroke   [] History of TIA  [] Aphasia   [] Vissual changes   [] Weakness or numbness in arm   [] Weakness or numbness in leg Musculoskeletal:   [] Joint swelling   [] Joint pain   [] Low back pain Hematologic:  [] Easy bruising  [] Easy bleeding   [] Hypercoagulable state   [] Anemic Gastrointestinal:  [] Diarrhea   [] Vomiting  [] Gastroesophageal reflux/heartburn   [] Difficulty swallowing. Genitourinary:  [] Chronic kidney disease   [] Difficult urination  [] Frequent urination   [] Blood in urine Skin:  [] Rashes   [] Ulcers  Psychological:  [] History of anxiety   []   History of major depression.  Physical Examination  There were no vitals filed for this visit. There is no height or weight on file to calculate BMI. Gen: WD/WN, NAD Head: /AT, No temporalis wasting.  Ear/Nose/Throat: Hearing grossly intact, nares w/o erythema or drainage, pinna without lesions Eyes: PER, EOMI, sclera nonicteric.  Neck: Supple, no gross masses.  No JVD.  Pulmonary:  Good air movement, no audible wheezing, no use of accessory muscles.  Cardiac: RRR, precordium not hyperdynamic. Vascular:  scattered varicosities present bilaterally.  Mild venous stasis  changes to the legs bilaterally.  3-4+ soft pitting edema left > right, CEAP [id number]  Vessel Right Left  Radial Palpable Palpable  Gastrointestinal: soft, non-distended. No guarding/no peritoneal signs.  Musculoskeletal: M/S 5/5 throughout.  No deformity.  Neurologic: CN 2-12 intact. Pain and light touch intact in extremities.  Symmetrical.  Speech is fluent. Motor exam as listed above. Psychiatric: Judgment intact, Mood & affect appropriate for pt's clinical situation. Dermatologic: Venous rashes no ulcers noted.  No changes consistent with cellulitis. Lymph : No lichenification or skin changes of chronic lymphedema.  CBC Lab Results  Component Value Date   WBC 4.9 03/26/2015   HGB 13.6 03/26/2015   HCT 40.2 03/26/2015   MCV 91.5 03/26/2015   PLT 93 (L) 03/26/2015    BMET    Component Value Date/Time   NA 142 12/02/2015 1118   NA 145 (H) 04/11/2015 1531   K 3.8 12/02/2015 1118   CL 105 12/02/2015 1118   CO2 26 12/02/2015 1118   GLUCOSE 96 12/02/2015 1118   BUN 19 12/02/2015 1118   BUN 13 04/11/2015 1531   CREATININE 0.78 12/02/2015 1118   CALCIUM 9.5 12/02/2015 1118   GFRNONAA 64 04/11/2015 1531   GFRAA 74 04/11/2015 1531   CrCl cannot be calculated (Patient's most recent lab result is older than the maximum 21 days allowed.).  COAG Lab Results  Component Value Date   INR 1.10 03/24/2015   INR 1.05 03/23/2015    Radiology No results found.   Assessment/Plan 1. Lymphedema Recommend:  I have had a long discussion with the patient regarding swelling and why it  causes symptoms.  Patient will begin wearing graduated compression on a daily basis a prescription was given. The patient will  wear the stockings first thing in the morning and removing them in the evening. The patient is instructed specifically not to sleep in the stockings.   In addition, behavioral modification will be initiated.  This will include frequent elevation, use of over the counter pain  medications and exercise such as walking.  Consideration for a lymph pump will also be made based upon the effectiveness of conservative therapy.  This would help to improve the edema control and prevent sequela such as ulcers and infections   Patient should undergo duplex ultrasound of the venous system to ensure that DVT or reflux is not present.  The patient will follow-up with me after the ultrasound.   - VAS [place] LOWER EXTREMITY VENOUS (DVT); Future  2. Essential hypertension Continue antihypertensive medications as already ordered, these medications have been reviewed and there are no changes at this time.  3. Type 2 diabetes mellitus treated with insulin (HCC) Continue hypoglycemic medications as already ordered, these medications have been reviewed and there are no changes at this time.  Hgb A1C to be monitored as already arranged by primary service  4. Hyperlipidemia LDL goal <70 Continue statin as ordered and reviewed, no changes at this time  Levora Dredge, MD  09/02/2022 12:09 PM

## 2022-09-03 ENCOUNTER — Encounter (INDEPENDENT_AMBULATORY_CARE_PROVIDER_SITE_OTHER): Payer: Self-pay | Admitting: Vascular Surgery

## 2022-09-03 ENCOUNTER — Ambulatory Visit (INDEPENDENT_AMBULATORY_CARE_PROVIDER_SITE_OTHER): Payer: Medicare Other | Admitting: Vascular Surgery

## 2022-09-03 VITALS — BP 209/121 | HR 80 | Resp 18 | Ht 62.0 in

## 2022-09-03 DIAGNOSIS — I890 Lymphedema, not elsewhere classified: Secondary | ICD-10-CM

## 2022-09-03 DIAGNOSIS — E119 Type 2 diabetes mellitus without complications: Secondary | ICD-10-CM

## 2022-09-03 DIAGNOSIS — E785 Hyperlipidemia, unspecified: Secondary | ICD-10-CM

## 2022-09-03 DIAGNOSIS — I10 Essential (primary) hypertension: Secondary | ICD-10-CM

## 2022-09-03 DIAGNOSIS — Z794 Long term (current) use of insulin: Secondary | ICD-10-CM

## 2022-09-04 ENCOUNTER — Telehealth (INDEPENDENT_AMBULATORY_CARE_PROVIDER_SITE_OTHER): Payer: Self-pay

## 2022-09-04 ENCOUNTER — Encounter (INDEPENDENT_AMBULATORY_CARE_PROVIDER_SITE_OTHER): Payer: Self-pay | Admitting: Vascular Surgery

## 2022-09-04 NOTE — Telephone Encounter (Signed)
Pt was seen in office yesterday and states that she needs an order for compression socks faxed to [facility].  I faxed the order over to [facility] and made pt aware

## 2022-10-24 ENCOUNTER — Other Ambulatory Visit (INDEPENDENT_AMBULATORY_CARE_PROVIDER_SITE_OTHER): Payer: Self-pay | Admitting: Vascular Surgery

## 2022-10-24 DIAGNOSIS — E119 Type 2 diabetes mellitus without complications: Secondary | ICD-10-CM

## 2022-10-24 DIAGNOSIS — E785 Hyperlipidemia, unspecified: Secondary | ICD-10-CM

## 2022-10-24 DIAGNOSIS — M7989 Other specified soft tissue disorders: Secondary | ICD-10-CM

## 2022-10-24 DIAGNOSIS — I10 Essential (primary) hypertension: Secondary | ICD-10-CM

## 2022-10-29 ENCOUNTER — Ambulatory Visit (INDEPENDENT_AMBULATORY_CARE_PROVIDER_SITE_OTHER): Payer: Medicare Other | Admitting: Vascular Surgery

## 2022-10-29 ENCOUNTER — Ambulatory Visit (INDEPENDENT_AMBULATORY_CARE_PROVIDER_SITE_OTHER): Payer: Medicare Other

## 2022-10-29 ENCOUNTER — Encounter (INDEPENDENT_AMBULATORY_CARE_PROVIDER_SITE_OTHER): Payer: Self-pay | Admitting: Vascular Surgery

## 2022-10-29 VITALS — BP 219/112 | HR 76 | Resp 16 | Wt 206.8 lb

## 2022-10-29 DIAGNOSIS — M7989 Other specified soft tissue disorders: Secondary | ICD-10-CM

## 2022-10-29 DIAGNOSIS — I10 Essential (primary) hypertension: Secondary | ICD-10-CM

## 2022-10-29 DIAGNOSIS — E119 Type 2 diabetes mellitus without complications: Secondary | ICD-10-CM | POA: Diagnosis not present

## 2022-10-29 DIAGNOSIS — E785 Hyperlipidemia, unspecified: Secondary | ICD-10-CM

## 2022-10-29 DIAGNOSIS — I890 Lymphedema, not elsewhere classified: Secondary | ICD-10-CM | POA: Diagnosis not present

## 2022-10-29 DIAGNOSIS — Z794 Long term (current) use of insulin: Secondary | ICD-10-CM

## 2022-10-29 NOTE — Progress Notes (Signed)
MRN : [id number]  Audrey Murray is a 72 y.o. ([birth year]) female who presents with chief complaint of legs hurt and swell.  History of Present Illness:   Patient is seen for evaluation of leg swelling. The patient first noticed the swelling remotely but is now concerned because of a significant increase in the overall edema. The swelling isn't associated with significant pain.  There has been an increasing amount of  discoloration noted by the patient. The patient notes that in the morning the legs are improved but they steadily worsened throughout the course of the day. Elevation seems to make the swelling of the legs better, dependency makes them much worse.    There is no history of ulcerations associated with the swelling.    The patient denies any recent changes in their medications.   The patient has not been wearing graduated compression.   The patient has no had any past angiography, interventions or vascular surgery.   The patient denies a history of DVT or PE. There is no prior history of phlebitis. There is no history of primary lymphedema.   There is no history of radiation treatment to the groin or pelvis No history of malignancies. No history of trauma or groin or pelvic surgery. No history of foreign travel or parasitic infections area   Duplex ultrasound of the venous system obtained today demonstrates deep venous reflux on the right.  There is trivial superficial reflux in the mid great saphenous vein on the right.  On the left there is no evidence for deep venous reflux.  There is reflux within the great saphenous and small saphenous veins.  No outpatient medications have been marked as taking for the 10/29/22 encounter (Appointment) with Gilda Crease, Latina Craver, MD.    Past Medical History:  Diagnosis Date   Diabetes mellitus without complication (HCC)    Heart attack (HCC)    Hyperlipidemia    Hypertension     Past Surgical History:  Procedure  Laterality Date   CARDIAC CATHETERIZATION N/A 03/25/2015   Procedure: Left Heart Cath and Coronary Angiography;  Surgeon: Dalia Heading, MD;  Location: [facility];  Service: Cardiovascular;  Laterality: N/A;    Social History Social History   Tobacco Use   Smoking status: Never   Smokeless tobacco: Never  Substance Use Topics   Alcohol use: No   Drug use: No    Family History Family History  Problem Relation Age of Onset   Stroke Mother    Diabetes Mother    Stroke Father    Hypertension Son    Asthma Son     No Known Allergies   REVIEW OF SYSTEMS (Negative unless checked)  Constitutional: [] Weight loss  [] Fever  [] Chills Cardiac: [] Chest pain   [] Chest pressure   [] Palpitations   [] Shortness of breath when laying flat   [] Shortness of breath with exertion. Vascular:  [] Pain in legs with walking   [x] Pain in legs at rest  [] History of DVT   [] Phlebitis   [x] Swelling in legs   [] Varicose veins   [] Non-healing ulcers Pulmonary:   [] Uses home oxygen   [] Productive cough   [] Hemoptysis   [] Wheeze  [] COPD   [] Asthma Neurologic:  [] Dizziness   [] Seizures   [] History of stroke   [] History of TIA  [] Aphasia   [] Vissual changes   [] Weakness or numbness in arm   [] Weakness or numbness in leg Musculoskeletal:   [] Joint swelling   []   Joint pain   [] Low back pain Hematologic:  [] Easy bruising  [] Easy bleeding   [] Hypercoagulable state   [] Anemic Gastrointestinal:  [] Diarrhea   [] Vomiting  [] Gastroesophageal reflux/heartburn   [] Difficulty swallowing. Genitourinary:  [] Chronic kidney disease   [] Difficult urination  [] Frequent urination   [] Blood in urine Skin:  [] Rashes   [] Ulcers  Psychological:  [] History of anxiety   []  History of major depression.  Physical Examination  There were no vitals filed for this visit. There is no height or weight on file to calculate BMI. Gen: WD/WN, NAD Head: [place]/AT, No temporalis wasting.  Ear/Nose/Throat: Hearing grossly intact, nares  w/o erythema or drainage, pinna without lesions Eyes: PER, EOMI, sclera nonicteric.  Neck: Supple, no gross masses.  No JVD.  Pulmonary:  Good air movement, no audible wheezing, no use of accessory muscles.  Cardiac: RRR, precordium not hyperdynamic. Vascular:  scattered varicosities present bilaterally.  Moderate venous stasis changes to the legs bilaterally.  3+ soft pitting edema. CEAP C4sEpAsPr   Vessel Right Left  Radial Palpable Palpable  Gastrointestinal: soft, non-distended. No guarding/no peritoneal signs.  Musculoskeletal: M/S 5/5 throughout.  No deformity.  Neurologic: CN 2-12 intact. Pain and light touch intact in extremities.  Symmetrical.  Speech is fluent. Motor exam as listed above. Psychiatric: Judgment intact, Mood & affect appropriate for pt's clinical situation. Dermatologic: Venous rashes no ulcers noted.  No changes consistent with cellulitis. Lymph : No lichenification or skin changes of chronic lymphedema.  CBC Lab Results  Component Value Date   WBC 4.9 03/26/2015   HGB 13.6 03/26/2015   HCT 40.2 03/26/2015   MCV 91.5 03/26/2015   PLT 93 (L) 03/26/2015    BMET    Component Value Date/Time   NA 142 12/02/2015 1118   NA 145 (H) 04/11/2015 1531   K 3.8 12/02/2015 1118   CL 105 12/02/2015 1118   CO2 26 12/02/2015 1118   GLUCOSE 96 12/02/2015 1118   BUN 19 12/02/2015 1118   BUN 13 04/11/2015 1531   CREATININE 0.78 12/02/2015 1118   CALCIUM 9.5 12/02/2015 1118   GFRNONAA 64 04/11/2015 1531   GFRAA 74 04/11/2015 1531   CrCl cannot be calculated (Patient's most recent lab result is older than the maximum 21 days allowed.).  COAG Lab Results  Component Value Date   INR 1.10 03/24/2015   INR 1.05 03/23/2015    Radiology No results found.   Assessment/Plan 1. Lymphedema Recommend:  No surgery or intervention at this point in time.   The Patient is CEAP C4sEpAsPr.  The patient has been wearing compression for more than 12 weeks with no or  little benefit.  The patient has been exercising daily for more than 12 weeks. The patient has been elevating and taking OTC pain medications for more than 12 weeks.  None of these have have eliminated the pain related to the lymphedema or the discomfort regarding excessive swelling and venous congestion.    I have reviewed my discussion with the patient regarding lymphedema and why it  causes symptoms.  Patient will continue wearing graduated compression on a daily basis. The patient should put the compression on first thing in the morning and removing them in the evening. The patient should not sleep in the compression.   In addition, behavioral modification throughout the day will be continued.  This will include frequent elevation (such as in a recliner), use of over the counter pain medications as needed and exercise such as walking.  The systemic causes  for chronic edema such as liver, kidney and cardiac etiologies do not appear to have significant changed over the past year.    The patient has chronic , severe lymphedema with hyperpigmentation of the skin and has done MLD, skin care, medication, diet, exercise, elevation and compression for 4 weeks with no improvement,  I am recommending a lymphedema pump.  The patient still has stage 3 lymphedema and therefore, I believe that a lymph pump is needed to improve the control of the patient's lymphedema and improve the quality of life.  Additionally, a lymph pump is warranted because it will reduce the risk of cellulitis and ulceration in the future.  Patient should follow-up in six months   2. Hyperlipidemia LDL goal <70 Continue statin as ordered and reviewed, no changes at this time  3. Essential hypertension Continue antihypertensive medications as already ordered, these medications have been reviewed and there are no changes at this time.  4. Type 2 diabetes mellitus treated with insulin (HCC) Continue hypoglycemic medications as already  ordered, these medications have been reviewed and there are no changes at this time.  Hgb A1C to be monitored as already arranged by primary service    Levora Dredge, MD  10/29/2022 12:23 PM

## 2022-11-04 ENCOUNTER — Encounter (INDEPENDENT_AMBULATORY_CARE_PROVIDER_SITE_OTHER): Payer: Self-pay | Admitting: Vascular Surgery

## 2022-12-19 ENCOUNTER — Telehealth (INDEPENDENT_AMBULATORY_CARE_PROVIDER_SITE_OTHER): Payer: Self-pay | Admitting: Nurse Practitioner

## 2022-12-19 NOTE — Telephone Encounter (Signed)
After 4 weeks of daily exercise, elevation, and compression patient lymphedema and hyperpigmentation has not improved. Recommendation of pump. Measurements: Left ankle  53.6cm (7/16);  53.7cm(8/13) Right ankle  33.3cm(7/16)  33.7cm(8/13) Left calf      54.0cm(7/16);  54.3cm(8/13) Right calf         46.7cm(7/16);  46.9cm(8/13)

## 2023-05-02 ENCOUNTER — Encounter (INDEPENDENT_AMBULATORY_CARE_PROVIDER_SITE_OTHER): Payer: Self-pay

## 2023-05-02 ENCOUNTER — Ambulatory Visit (INDEPENDENT_AMBULATORY_CARE_PROVIDER_SITE_OTHER): Payer: Medicare Other | Admitting: Nurse Practitioner

## 2023-06-08 IMAGING — CR DG THORACIC SPINE 2V
3 series · 3 of 3 positions shown · non-contrast
Comparison: None.

CLINICAL DATA: Fall, back injury

EXAM:
THORACIC SPINE 2 VIEWS

[t-spine ap]
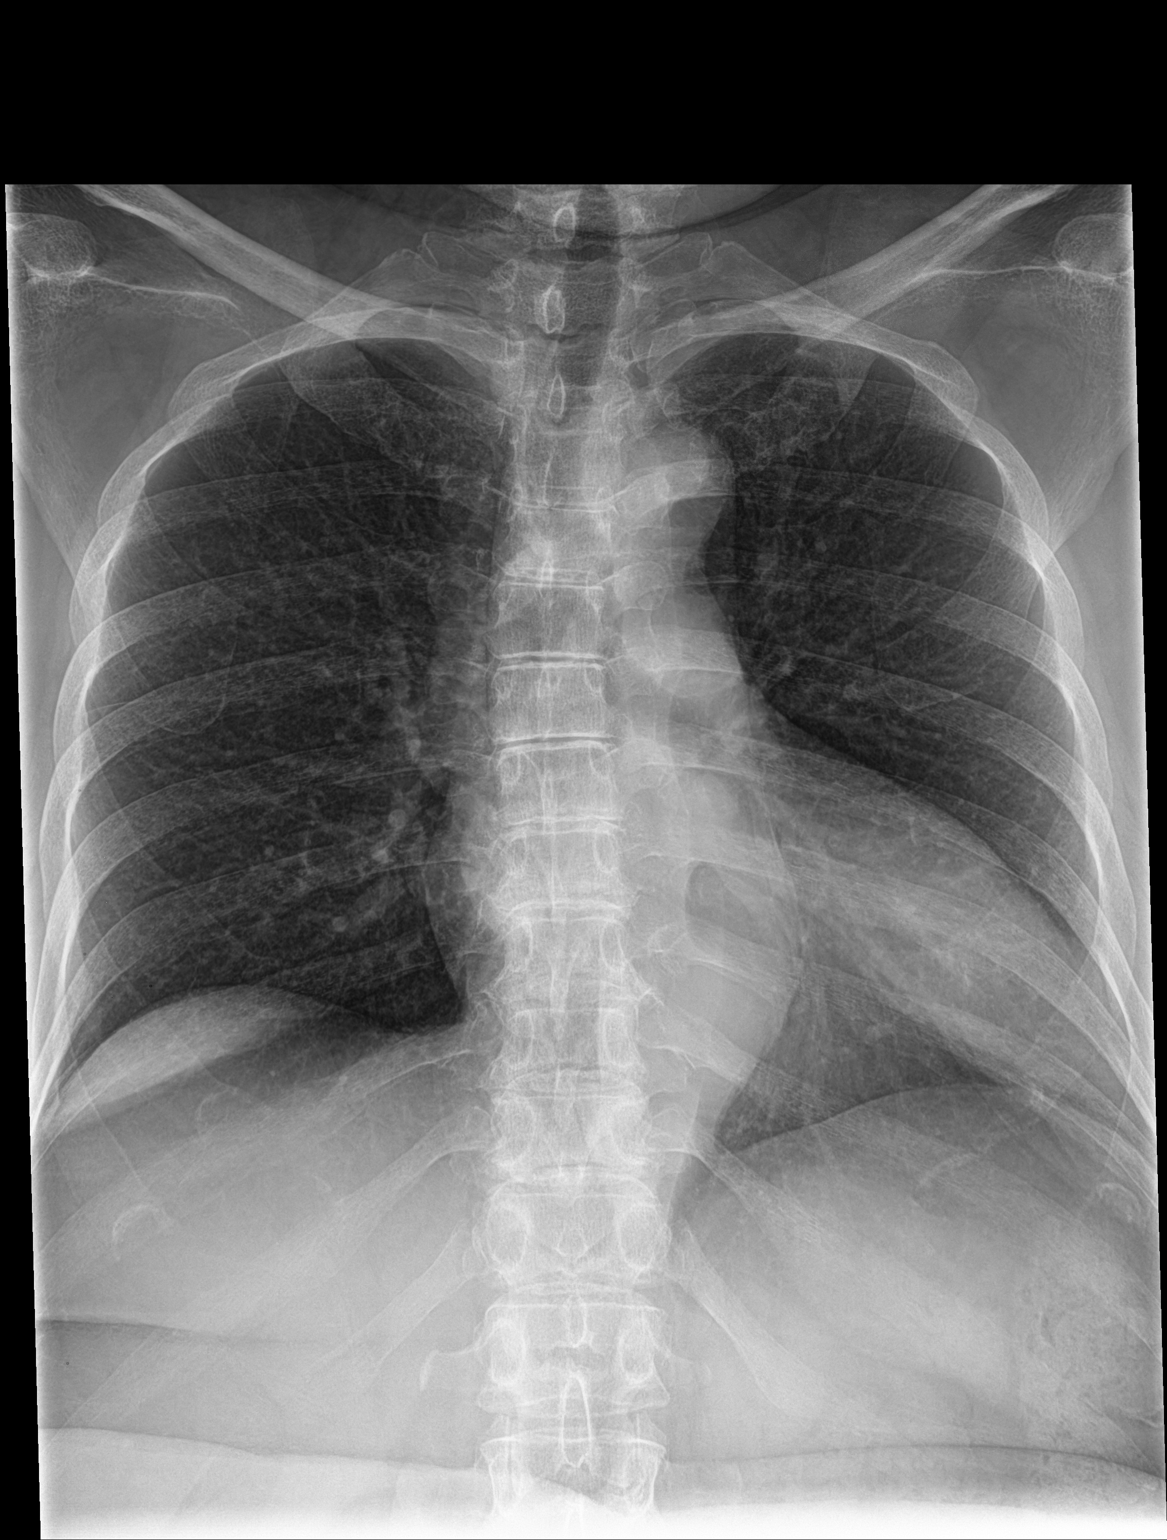

[t-spine lat]
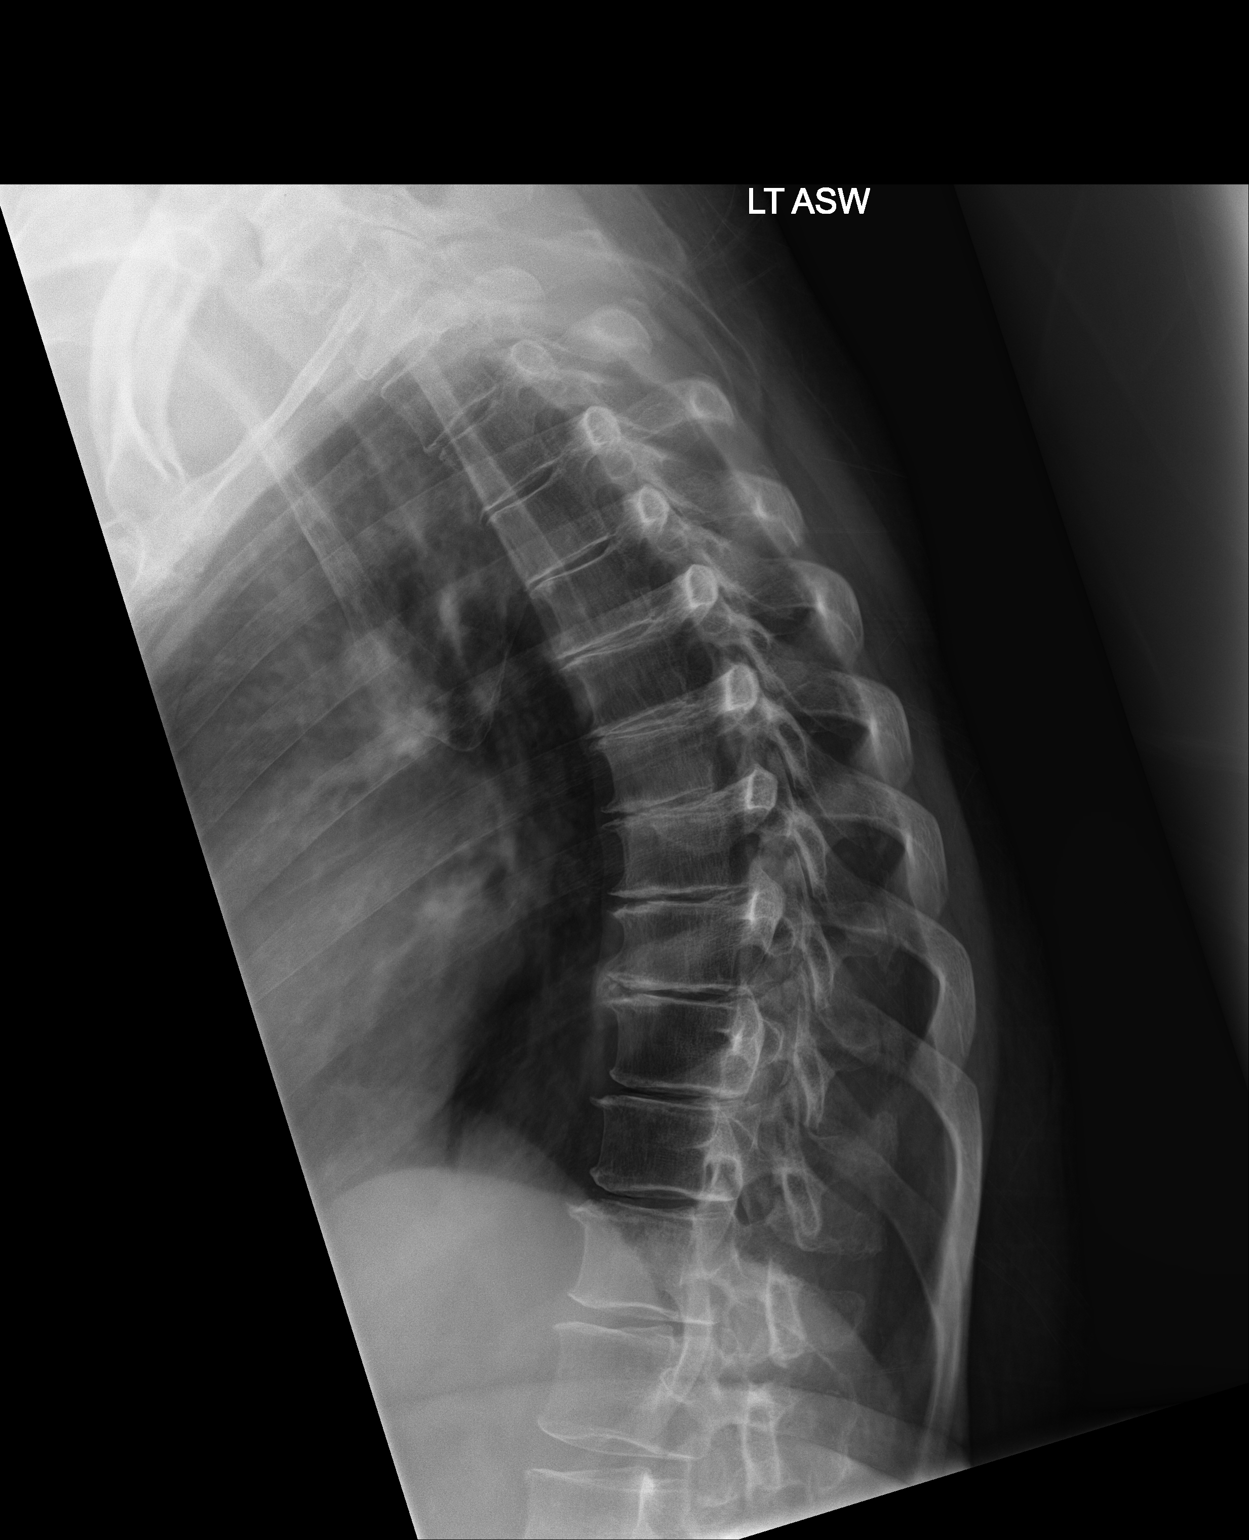

[t-spine swimmers]
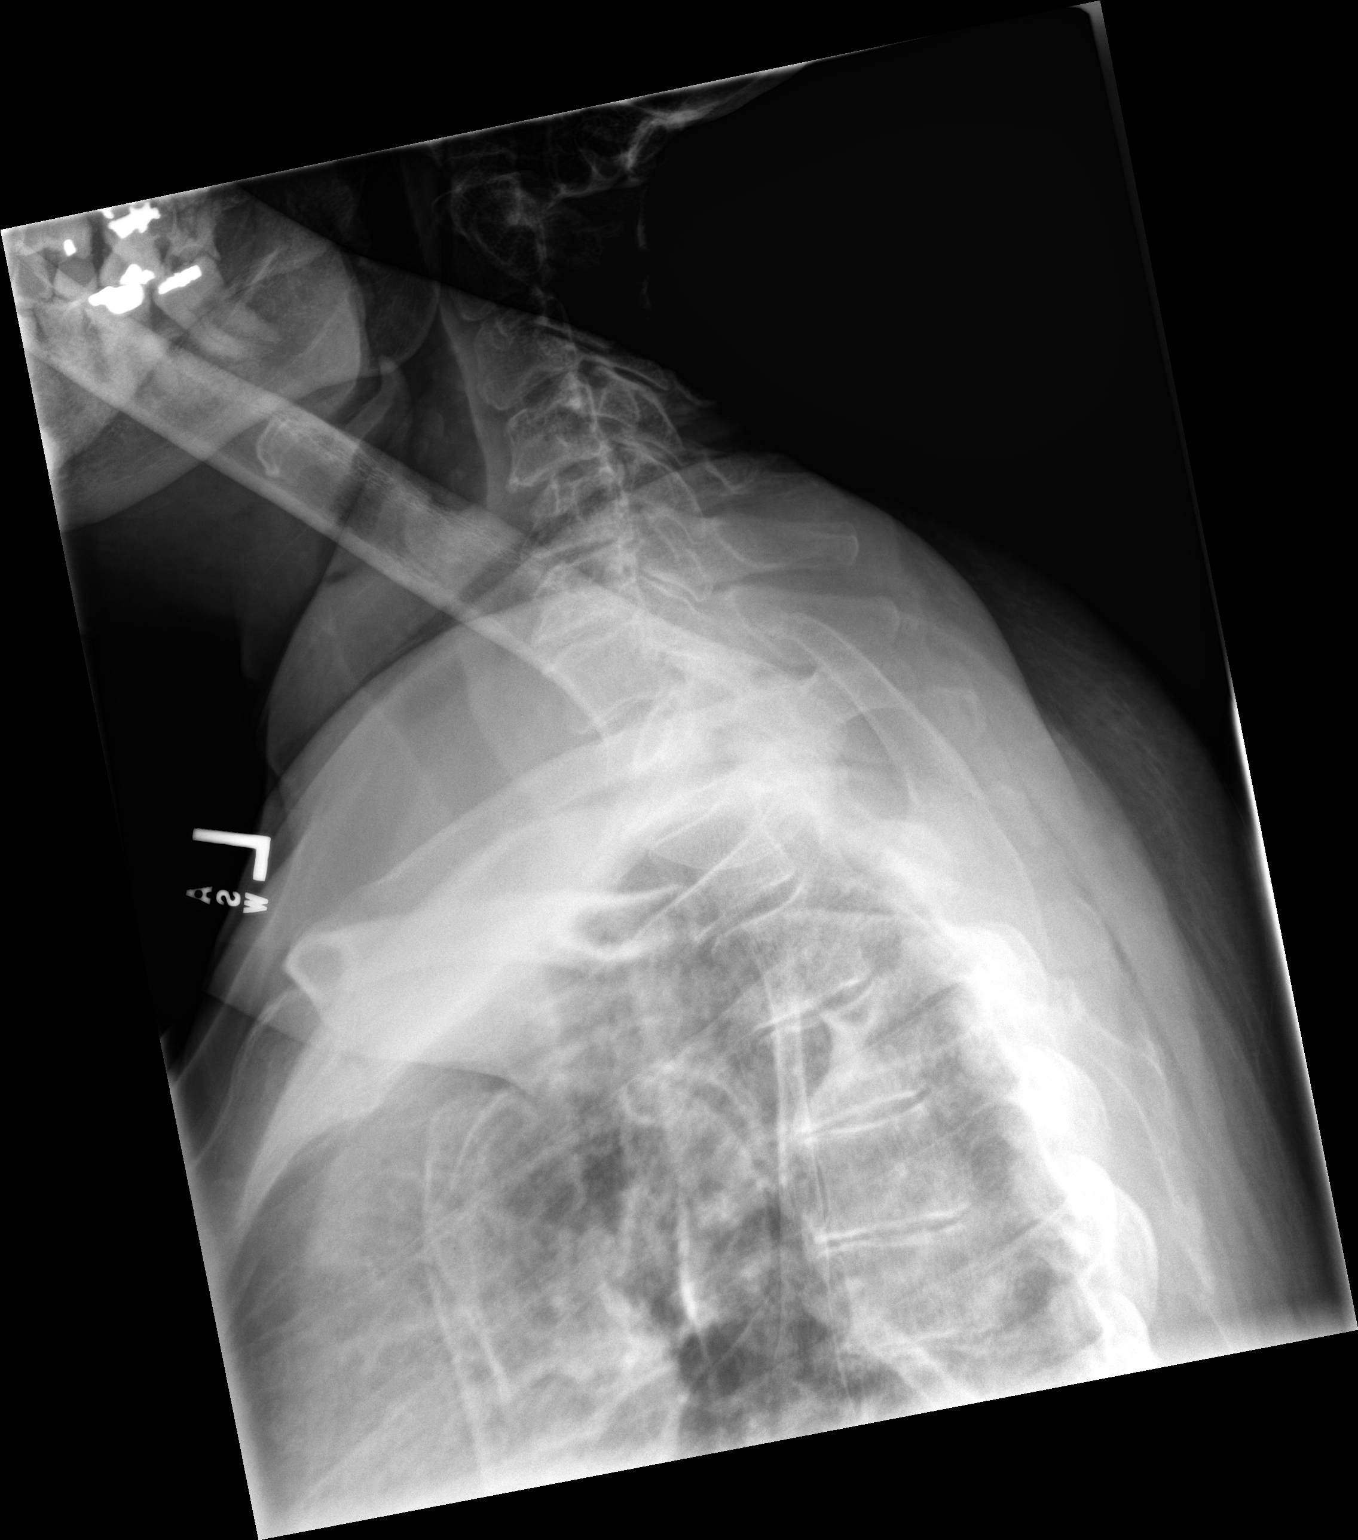

[3 of 3 positions shown; findings below may reference images not displayed]

FINDINGS: No fracture or dislocation is seen.

Mild degenerative changes of the mid thoracic spine.

Visualized lungs are clear.
IMPRESSION: Negative.

## 2023-09-24 ENCOUNTER — Encounter (INDEPENDENT_AMBULATORY_CARE_PROVIDER_SITE_OTHER): Payer: Self-pay
